# Patient Record
Sex: Female | Born: 1975 | Race: White | Hispanic: No | Smoking: Former smoker
Health system: Southern US, Community
[De-identification: ages and names within clinical notes are randomized; demographics above are authoritative.]

## PROBLEM LIST (undated history)

## (undated) DIAGNOSIS — F988 Other specified behavioral and emotional disorders with onset usually occurring in childhood and adolescence: Secondary | ICD-10-CM

## (undated) DIAGNOSIS — K635 Polyp of colon: Secondary | ICD-10-CM

## (undated) HISTORY — PX: FACIAL COSMETIC SURGERY: SHX629

## (undated) HISTORY — PX: ANTERIOR CERVICAL DECOMP/DISCECTOMY FUSION: SHX1161

## (undated) HISTORY — PX: OTHER SURGICAL HISTORY: SHX169

## (undated) HISTORY — DX: Other specified behavioral and emotional disorders with onset usually occurring in childhood and adolescence: F98.8

## (undated) HISTORY — DX: Polyp of colon: K63.5

---

## 1999-02-27 ENCOUNTER — Encounter: Payer: Self-pay | Admitting: Oral Surgery

## 1999-03-05 ENCOUNTER — Ambulatory Visit (HOSPITAL_COMMUNITY): Admission: RE | Admit: 1999-03-05 | Discharge: 1999-03-05 | Payer: Self-pay | Admitting: Oral Surgery

## 2000-08-18 ENCOUNTER — Other Ambulatory Visit: Admission: RE | Admit: 2000-08-18 | Discharge: 2000-08-18 | Payer: Self-pay | Admitting: *Deleted

## 2000-12-18 ENCOUNTER — Encounter: Admission: RE | Admit: 2000-12-18 | Discharge: 2001-03-18 | Payer: Self-pay | Admitting: *Deleted

## 2001-01-01 ENCOUNTER — Observation Stay (HOSPITAL_COMMUNITY): Admission: AD | Admit: 2001-01-01 | Discharge: 2001-01-02 | Payer: Self-pay | Admitting: *Deleted

## 2001-02-20 ENCOUNTER — Inpatient Hospital Stay (HOSPITAL_COMMUNITY): Admission: AD | Admit: 2001-02-20 | Discharge: 2001-02-20 | Payer: Self-pay | Admitting: Gynecology

## 2001-02-22 ENCOUNTER — Inpatient Hospital Stay (HOSPITAL_COMMUNITY): Admission: AD | Admit: 2001-02-22 | Discharge: 2001-02-22 | Payer: Self-pay | Admitting: Gynecology

## 2001-03-12 ENCOUNTER — Inpatient Hospital Stay (HOSPITAL_COMMUNITY): Admission: AD | Admit: 2001-03-12 | Discharge: 2001-03-14 | Payer: Self-pay | Admitting: Gynecology

## 2001-04-26 ENCOUNTER — Other Ambulatory Visit: Admission: RE | Admit: 2001-04-26 | Discharge: 2001-04-26 | Payer: Self-pay | Admitting: *Deleted

## 2003-07-01 ENCOUNTER — Inpatient Hospital Stay (HOSPITAL_COMMUNITY): Admission: AD | Admit: 2003-07-01 | Discharge: 2003-07-01 | Payer: Self-pay | Admitting: Obstetrics & Gynecology

## 2003-07-01 ENCOUNTER — Encounter: Payer: Self-pay | Admitting: Obstetrics & Gynecology

## 2003-10-30 ENCOUNTER — Inpatient Hospital Stay (HOSPITAL_COMMUNITY): Admission: AD | Admit: 2003-10-30 | Discharge: 2003-10-30 | Payer: Self-pay | Admitting: Obstetrics & Gynecology

## 2003-11-15 ENCOUNTER — Inpatient Hospital Stay (HOSPITAL_COMMUNITY): Admission: AD | Admit: 2003-11-15 | Discharge: 2003-11-16 | Payer: Self-pay | Admitting: *Deleted

## 2003-11-21 ENCOUNTER — Inpatient Hospital Stay (HOSPITAL_COMMUNITY): Admission: AD | Admit: 2003-11-21 | Discharge: 2003-11-23 | Payer: Self-pay | Admitting: Obstetrics and Gynecology

## 2004-01-11 ENCOUNTER — Other Ambulatory Visit: Admission: RE | Admit: 2004-01-11 | Discharge: 2004-01-11 | Payer: Self-pay | Admitting: Obstetrics and Gynecology

## 2010-01-19 ENCOUNTER — Inpatient Hospital Stay (HOSPITAL_COMMUNITY): Admission: AD | Admit: 2010-01-19 | Discharge: 2010-01-21 | Payer: Self-pay | Admitting: Obstetrics and Gynecology

## 2010-11-25 LAB — CBC
HCT: 34.2 % — ABNORMAL LOW (ref 36.0–46.0)
Hemoglobin: 11.9 g/dL — ABNORMAL LOW (ref 12.0–15.0)
MCHC: 34.7 g/dL (ref 30.0–36.0)
MCV: 91.7 fL (ref 78.0–100.0)
Platelets: 224 10*3/uL (ref 150–400)
RBC: 3.73 MIL/uL — ABNORMAL LOW (ref 3.87–5.11)
RDW: 13.9 % (ref 11.5–15.5)
WBC: 13.7 10*3/uL — ABNORMAL HIGH (ref 4.0–10.5)

## 2010-11-25 LAB — CCBB MATERNAL DONOR DRAW

## 2010-11-26 LAB — CBC
HCT: 37.7 % (ref 36.0–46.0)
Hemoglobin: 13.1 g/dL (ref 12.0–15.0)
MCHC: 34.6 g/dL (ref 30.0–36.0)
MCV: 91.2 fL (ref 78.0–100.0)
Platelets: 260 10*3/uL (ref 150–400)
RBC: 4.13 MIL/uL (ref 3.87–5.11)
RDW: 13.4 % (ref 11.5–15.5)
WBC: 11.6 10*3/uL — ABNORMAL HIGH (ref 4.0–10.5)

## 2010-11-26 LAB — GLUCOSE, CAPILLARY: Glucose-Capillary: 98 mg/dL (ref 70–99)

## 2010-11-26 LAB — RPR: RPR Ser Ql: NONREACTIVE

## 2011-01-24 NOTE — Consult Note (Signed)
[facility]  Patient:    Donna Bowen                  MRN: [id number] Attending:  Gaetano Hawthorne. Lily Peer, M.D.                          Consultation Report  HISTORY:                      The patient is a 35 year old gravida 2, para 1 at approximately 37-2/[redacted] weeks gestation who presented to [facility] for evaluation of an ongoing rash that she has experienced over the past few days. Approximately two days after having initiated Flagyl for bacterial vaginosis, she started noticing the rash.  The rash does not appear to be vesicular.  It is flat, noncrusted, slightly erythematous and raised, typical of PUPPS.  The patient denied any fever, nausea or vomiting.  The rash is noted mostly in the trunk and the lower extremities and not in the back, and some in the forearm area.  The patient was placed on the monitor.  She had a reactive nonstress test and frequent mild contractions noted.  CBC and comprehensive metabolic panel were otherwise unremarkable.  Her vital signs in the emergency room were as follows: temperature 97.4, pulse 83, respirations 20, blood pressure 128/74.  The patient will be released from the hospital on Deltasone tablets (pack), 5 mg to taper over one week.  She may continue applying the Benadryl or calamine lotion topically p.r.n. and, for an antihistamine agent, she was given a prescription for Claritin that she an take 10 mg p.o. q.d.  She is scheduled to follow up in the office next week for her routine office visit. Of note, there was no evidence of ______ noted.  All of the above was explained to the patient and her husband.  All questions were answered.  Will follow accordingly. DD:  02/20/01 TD:  02/20/01 Job: [id number]

## 2011-01-24 NOTE — Discharge Summary (Signed)
[facility]  Patient:    Donna Bowen, Donna Bowen                   MRN: [id number] Adm. Date:  [id number] Disc. Date: [id number] Attending:  Douglass Rivers Dictator:   Antony Contras, [facility]                           Discharge Summary  DISCHARGE DIAGNOSES:          1. Intrauterine pregnancy at term.                               2. History of marginal gestational diabetes.  PROCEDURES:                   1. Induction of labor.                               2. Normal spontaneous vaginal delivery of                                  viable infant over intact perineum with                                  repair of first-degree labial laceration.  HISTORY OF PRESENT ILLNESS:   The patient is a 35 year old, gravida 2, para 1-0-0-1 with LMP June 04, 2000 and [facility] March 11, 2001. Prenatal risk factors include a history of CIN-1 with cryosurgery, also marginal gestational diabetes.  PRENATAL LABORATORY DATA:     Blood type O positive, antibody screen negative. RPR, HBsAg, HIV nonreactive. MSAFP normal. GBS is negative.  HOSPITAL COURSE AND TREATMENT:                The patient was admitted on March 12, 2001 for induction of labor. The cervix was 2 to 3 cm. Artificial rupture of membranes revealed clear fluid. She progressed to complete dilatation, delivered an Apgar 8/9 female infant weighing 8 pounds 7 ounces over intact perineum with repair of a first-degree labial laceration.  POSTPARTUM COURSE:            She remained afebrile, had no difficulty voiding, and was able to be discharged in satisfactory condition on her second postpartum day.  CBC: Hematocrit 37.7, hemoglobin was 13.3, WBCs 10.8, platelets 214,000.  DISPOSITION:                  The patient is to follow up in six weeks.  DISCHARGE MEDICATIONS:        The patient is to continue with prenatal vitamins and iron, with Motrin and Tylox for pain. DD:  04/09/01 TD:  04/11/01 Job: [id number]

## 2011-01-24 NOTE — H&P (Signed)
NAME:  Donna Bowen, Donna Bowen                      ACCOUNT NO.:  [id number]   MEDICAL RECORD NO.:  [id number]                   PATIENT TYPE:   LOCATION:                                       FACILITY:  [facility]   PHYSICIAN:  Richardean Sale, M.D.                DATE OF BIRTH:  07-12-1976   DATE OF ADMISSION:  11/21/2003  DATE OF DISCHARGE:                                HISTORY & PHYSICAL   ADMISSION DIAGNOSES:  39-1/2 week pregnancy with history of macrosomic  infant, favorable cervix, group B beta strep positive.   HISTORY OF PRESENT ILLNESS:  This is a 35 year old, gravida 3, para 2-0-0-2,  white female who was at 39-1/[redacted] weeks gestation with estimated due date of  November 23, 2003, who presents for induction of labor secondary to history of  nine pound baby, group B beta strep positive, and favorable cervix. The  patient reports good fetal movement. Denies any vaginal bleeding or loss of  fluid. Reports irregular contractions. Pregnancy complicated by an elevated  one hour Glucola with a history of gestational diabetes, three-hour Glucola  was within normal limits.  The patient is formerly a smoker and quit in  2004.  She also had first trimester bleeding which has resolved.   PAST MEDICAL HISTORY:  Noncontributory.   PAST SURGICAL HISTORY:  None.   SOCIAL HISTORY:  Former smoker. First child given up for adoption.   OBSTETRIC HISTORY:  Vaginal delivery times two with history of gestational  diabetes and a 9 pound infant.   GYNECOLOGIC HISTORY:  Positive history of HPV. No history of gonorrhea,  Chlamydia, or herpes.   FAMILY HISTORY:  No known congenital anomalies, spina bifida, Down syndrome,  other genetic disorders, or inherited defects. No known coagulopathies or  blood dyscrasias.   PHYSICAL EXAMINATION:  VITAL SIGNS: She is afebrile. Her vital signs are  stable.  GENERAL: She is a well-developed, well-nourished white female in no apparent  distress.  HEART: Regular rate and  rhythm.  LUNGS: Clear to auscultation bilaterally.  ABDOMEN: Gravid, soft, and nontender. Appears AGA.  PELVIC EXAM: On cervical exam, 2 cm, 70%, -1, and vertex.  EXTREMITIES: No clubbing, cyanosis, or edema. Nontender.   LABORATORY DATA:  Group beta strep positive.  Blood type O positive.  Antibody screen negative. Antibody nonreactive. Rubella immune. Triple test  was within normal limits.  One-hour Glucola 144, three-hour Glucola within  normal limits. Hepatitis B negative. HIV negative. Gonorrhea and Chlamydia  negative. Pap smear within normal limits.   ASSESSMENT:  A 35 year old, gravida 2, para 2-0-0-2, white female at 39.5  weeks with favorable cervix, history of 9 pound baby at delivery, and group  B beta strep positive.   PLAN:  Will admit for Pitocin induction and amniotomy. Risks of induction  reviewed with the patient.  The patient is aware of her increased risks of  Cesarean section with induction. Will administer penicillin for group  B beta  strep prophylaxis, with continuous fetal monitoring while in labor.                                               Richardean Sale, M.D.    JW/MEDQ  D:  11/20/2003  T:  11/20/2003  Job:  [id number]

## 2011-04-24 ENCOUNTER — Other Ambulatory Visit: Payer: Self-pay | Admitting: Family Medicine

## 2011-04-24 ENCOUNTER — Ambulatory Visit (HOSPITAL_COMMUNITY)
Admission: RE | Admit: 2011-04-24 | Discharge: 2011-04-24 | Disposition: A | Discharge status: Home / Self Care | Payer: PRIVATE HEALTH INSURANCE | Source: Ambulatory Visit | Attending: Family Medicine | Admitting: Family Medicine

## 2011-04-24 DIAGNOSIS — M545 Low back pain, unspecified: Secondary | ICD-10-CM

## 2011-04-24 DIAGNOSIS — G8929 Other chronic pain: Secondary | ICD-10-CM

## 2012-12-28 ENCOUNTER — Telehealth: Payer: Self-pay | Admitting: Family Medicine

## 2012-12-28 NOTE — Telephone Encounter (Signed)
Rx upfront ready for pickup. Left message on voicemail notifying patient.

## 2012-12-28 NOTE — Telephone Encounter (Signed)
Needs a prescription for Adderall.  Please call patient when the prescription is ready to be picked up.

## 2012-12-28 NOTE — Telephone Encounter (Signed)
Ok. Must have a visit before any further written

## 2013-01-04 ENCOUNTER — Encounter: Payer: Self-pay | Admitting: *Deleted

## 2013-02-03 ENCOUNTER — Ambulatory Visit (INDEPENDENT_AMBULATORY_CARE_PROVIDER_SITE_OTHER): Payer: Commercial Managed Care - PPO | Admitting: Nurse Practitioner

## 2013-02-03 ENCOUNTER — Encounter: Payer: Self-pay | Admitting: Nurse Practitioner

## 2013-02-03 VITALS — BP 124/80 | HR 70 | Temp 97.9°F | Resp 14 | Ht 65.5 in | Wt 142.0 lb

## 2013-02-03 DIAGNOSIS — Z0000 Encounter for general adult medical examination without abnormal findings: Secondary | ICD-10-CM

## 2013-02-03 DIAGNOSIS — N925 Other specified irregular menstruation: Secondary | ICD-10-CM

## 2013-02-03 DIAGNOSIS — F988 Other specified behavioral and emotional disorders with onset usually occurring in childhood and adolescence: Secondary | ICD-10-CM

## 2013-02-03 DIAGNOSIS — N949 Unspecified condition associated with female genital organs and menstrual cycle: Secondary | ICD-10-CM

## 2013-02-03 DIAGNOSIS — N938 Other specified abnormal uterine and vaginal bleeding: Secondary | ICD-10-CM

## 2013-02-03 DIAGNOSIS — Z01419 Encounter for gynecological examination (general) (routine) without abnormal findings: Secondary | ICD-10-CM

## 2013-02-03 MED ORDER — AMPHETAMINE-DEXTROAMPHETAMINE 10 MG PO TABS: ORAL_TABLET | ORAL | Status: DC

## 2013-02-03 MED ORDER — AMPHETAMINE-DEXTROAMPHET ER 20 MG PO CP24: 20.0000 mg | ORAL_CAPSULE | ORAL | Status: DC

## 2013-02-03 MED ORDER — LEVONORGESTREL-ETHINYL ESTRAD 0.1-20 MG-MCG PO TABS: 1.0000 | ORAL_TABLET | Freq: Every day | ORAL | Status: DC

## 2013-02-04 ENCOUNTER — Encounter: Payer: Self-pay | Admitting: Nurse Practitioner

## 2013-02-04 DIAGNOSIS — F988 Other specified behavioral and emotional disorders with onset usually occurring in childhood and adolescence: Secondary | ICD-10-CM | POA: Insufficient documentation

## 2013-02-04 DIAGNOSIS — N938 Other specified abnormal uterine and vaginal bleeding: Secondary | ICD-10-CM | POA: Insufficient documentation

## 2013-02-04 NOTE — Assessment & Plan Note (Addendum)
Switch to Adderall XR 20 mg in the morning with his dose of Adderall 10 mg about 7-8 hours later. Recheck in 6 months.

## 2013-02-04 NOTE — Assessment & Plan Note (Signed)
Will do a trial of continuous low-dose birth control pills.

## 2013-02-04 NOTE — Progress Notes (Signed)
Subjective:    Patient ID: Donna Bowen, female    DOB: [birth year], 37 y.o.   MRN: [id number]  HPI presents for her wellness checkup. Married, same sexual partner. No vaginal discharge or pelvic pain or fever. Has permanent birth control. Having slightly irregular cycles which are now very heavy and painful for about 3 days. Also having spotting in between cycles lasting up to week. Nonsmoker. Regular eye exams. Regular dental exams. Is currently on Adderall twice a day. Would like to switch to ask Adderall XR in the morning with low dose Adderall for the afternoon. Currently in nursing school. Limited exercise due to her current schedule. Tries to eat a healthy diet but this is difficult.    Review of Systems  Constitutional: Negative for activity change, appetite change and fatigue.  HENT: Negative for dental problem.   Eyes: Negative for visual disturbance.  Respiratory: Negative for chest tightness, shortness of breath and wheezing.   Cardiovascular: Negative for chest pain and palpitations.  Gastrointestinal: Negative for vomiting, abdominal pain, diarrhea, constipation and abdominal distention.  Genitourinary: Positive for vaginal bleeding and menstrual problem. Negative for dysuria, vaginal discharge, difficulty urinating and pelvic pain.       Objective:   Physical Exam  Constitutional: She is oriented to person, place, and time. She appears well-developed. No distress.  HENT:  Right Ear: External ear normal.  Left Ear: External ear normal.  Mouth/Throat: Oropharynx is clear and moist.  Neck: Normal range of motion. Neck supple. No tracheal deviation present. No thyromegaly present.  Cardiovascular: Normal rate, regular rhythm and normal heart sounds.  Exam reveals no gallop.   No murmur heard. Pulmonary/Chest: Effort normal and breath sounds normal.  Abdominal: Soft. She exhibits no distension and no mass. There is no tenderness.  Genitourinary: Vagina normal and uterus  normal. No vaginal discharge found.  Musculoskeletal: She exhibits no edema.  Lymphadenopathy:    She has no cervical adenopathy.  Neurological: She is alert and oriented to person, place, and time.  Skin: Skin is warm and dry. No rash noted.  Psychiatric: She has a normal mood and affect. Her behavior is normal.          Assessment & Plan:  Well woman exam  ADD (attention deficit disorder)  DUB (dysfunctional uterine bleeding)  Meds ordered this encounter  Medications  . levonorgestrel-ethinyl estradiol (AVIANE,ALESSE,LESSINA) 0.1-20 MG-MCG tablet    Sig: Take 1 tablet by mouth daily.    Dispense:  3 Package    Refill:  3    Want to order continuous birth control pills; patient is a just received [employer] insurance; please hold Rx until she sets up account with you    Order Specific Question:  Supervising Provider    Answer:  Merlyn Albert [[id number]]  . amphetamine-dextroamphetamine (ADDERALL XR) 20 MG 24 hr capsule    Sig: Take 1 capsule (20 mg total) by mouth every morning.    Dispense:  30 capsule    Refill:  0    Order Specific Question:  Supervising Provider    Answer:  Merlyn Albert [[id number]]  . amphetamine-dextroamphetamine (ADDERALL) 10 MG tablet    Sig: Take on in the afternoon about 8 hours after morning dose    Dispense:  30 tablet    Refill:  0    Order Specific Question:  Supervising Provider    Answer:  Merlyn Albert [[id number]]   Discussed risks associated with use of hormones including blood clots and cancer risk.  Will try low-dose birth control pills on a continuous basis to stop her bleeding. Recheck in 6 months, call back sooner if any problems. Next physical in one year.

## 2013-03-14 ENCOUNTER — Telehealth: Payer: Self-pay | Admitting: Family Medicine

## 2013-03-14 NOTE — Telephone Encounter (Signed)
Patient needs Rx for both her Adderall ... Also, she would like 3 month prescriptions for these.

## 2013-03-14 NOTE — Telephone Encounter (Signed)
Chart in rack to be brought back

## 2013-03-14 NOTE — Telephone Encounter (Signed)
  i need chart

## 2013-03-15 MED ORDER — AMPHETAMINE-DEXTROAMPHETAMINE 10 MG PO TABS: ORAL_TABLET | ORAL | Status: DC

## 2013-03-15 MED ORDER — AMPHETAMINE-DEXTROAMPHET ER 20 MG PO CP24: 20.0000 mg | ORAL_CAPSULE | ORAL | Status: DC

## 2013-03-15 NOTE — Telephone Encounter (Signed)
Pt needs both adderall xr and plain adderall written. 90 d ok. Tell pt needs ov with me before these are finished

## 2013-03-15 NOTE — Telephone Encounter (Signed)
Left message on voicemail notifying patient RX upfront ready for pickup and schedule OV.

## 2013-03-19 ENCOUNTER — Encounter (HOSPITAL_COMMUNITY): Payer: Self-pay | Admitting: *Deleted

## 2013-03-19 ENCOUNTER — Emergency Department (HOSPITAL_COMMUNITY)
Admission: EM | Admit: 2013-03-19 | Discharge: 2013-03-19 | Disposition: A | Discharge status: Home / Self Care | Payer: 59 | Source: Home / Self Care | Attending: Family Medicine | Admitting: Family Medicine

## 2013-03-19 DIAGNOSIS — J329 Chronic sinusitis, unspecified: Secondary | ICD-10-CM

## 2013-03-19 MED ORDER — FLUCONAZOLE 150 MG PO TABS: 150.0000 mg | ORAL_TABLET | Freq: Once | ORAL | Status: DC

## 2013-03-19 MED ORDER — AZITHROMYCIN 250 MG PO TABS: 250.0000 mg | ORAL_TABLET | Freq: Every day | ORAL | Status: DC

## 2013-03-19 NOTE — ED Provider Notes (Signed)
   History    CSN: [id number] Arrival date & time 03/19/13  1111  None    Chief Complaint  Patient presents with  . URI   (Consider location/radiation/quality/duration/timing/severity/associated sxs/prior Treatment) Patient is a 37 y.o. female presenting with URI. The history is provided by the patient. No language interpreter was used.  URI Presenting symptoms: congestion, cough, facial pain and rhinorrhea   Severity:  Moderate Onset quality:  Gradual Duration:  4 days Timing:  Constant Progression:  Worsening Chronicity:  New Worsened by:  Nothing tried Associated symptoms: headaches    Past Medical History  Diagnosis Date  . ADD (attention deficit disorder)     Adult   History reviewed. No pertinent past surgical history. No family history on file. History  Substance Use Topics  . Smoking status: Former Games developer  . Smokeless tobacco: Not on file  . Alcohol Use: Yes   OB History   Grav Para Term Preterm Abortions TAB SAB Ect Mult Living                 Review of Systems  HENT: Positive for congestion and rhinorrhea.   Respiratory: Positive for cough.   Neurological: Positive for headaches.  All other systems reviewed and are negative.    Allergies  Review of patient's allergies indicates no known allergies.  Home Medications   Current Outpatient Rx  Name  Route  Sig  Dispense  Refill  . amphetamine-dextroamphetamine (ADDERALL XR) 20 MG 24 hr capsule   Oral   Take 1 capsule (20 mg total) by mouth every morning.   90 capsule   0   . amphetamine-dextroamphetamine (ADDERALL) 10 MG tablet      Take on in the afternoon about 8 hours after morning dose   90 tablet   0   . levonorgestrel-ethinyl estradiol (AVIANE,ALESSE,LESSINA) 0.1-20 MG-MCG tablet   Oral   Take 1 tablet by mouth daily.   3 Package   3     Want to order continuous birth control pills; pati ...    BP 128/85  Pulse 87  Temp(Src) 98.4 F (36.9 C) (Oral)  Resp 14  SpO2 100%  LMP  03/12/2013 Physical Exam  Nursing note and vitals reviewed. HENT:  Head: Normocephalic.  Right Ear: External ear normal.  Left Ear: External ear normal.  Nose: Nose normal.  Mouth/Throat: Oropharynx is clear and moist.  Eyes: Conjunctivae are normal. Pupils are equal, round, and reactive to light.  Tender maxillay sinuses  Neck: Normal range of motion. Neck supple.  Abdominal: Soft.  Musculoskeletal: Normal range of motion.  Neurological: She is alert.  Skin: Skin is warm.    ED Course  Procedures (including critical care time) Labs Reviewed - No data to display No results found. 1. Sinusitis     MDM  z pack,   diflucan  Elson Areas, PA-C 03/19/13 1244

## 2013-03-19 NOTE — ED Notes (Signed)
Pt  Reports  Symptoms  Of  Sinus  Congestion   /  Cough          Scratchy  Throat       And  A  Headache  Symptoms  X  4  Days        Pt  Setting  Upright on  Exam  Table  Speaking inn complete  sentances  And  Is  In no  Acute  Distress

## 2013-03-21 NOTE — ED Provider Notes (Signed)
Medical screening examination/treatment/procedure(s) were performed by non-physician practitioner and as supervising physician I was immediately available for consultation/collaboration.   [facility]; MD  Sharin Grave, MD 03/21/13 1744

## 2013-04-19 ENCOUNTER — Telehealth: Payer: Self-pay | Admitting: Family Medicine

## 2013-04-19 ENCOUNTER — Other Ambulatory Visit: Payer: Self-pay | Admitting: Family Medicine

## 2013-04-19 NOTE — Telephone Encounter (Signed)
Diflucan 150 numb two one po 3 d apart

## 2013-04-19 NOTE — Telephone Encounter (Signed)
Patient needs Rx for yeast infection to [facility] [phone]  Lorain Childes she is in [place] visiting parents

## 2013-04-19 NOTE — Telephone Encounter (Signed)
Called in Diflucan 150 numb two one po 3 d apart to number listed below. Left message on voicemail notifying patient.

## 2013-04-22 ENCOUNTER — Other Ambulatory Visit: Payer: Self-pay | Admitting: Family Medicine

## 2013-06-21 ENCOUNTER — Encounter: Payer: Self-pay | Admitting: Family Medicine

## 2013-06-21 ENCOUNTER — Ambulatory Visit (INDEPENDENT_AMBULATORY_CARE_PROVIDER_SITE_OTHER): Payer: Commercial Managed Care - PPO | Admitting: Family Medicine

## 2013-06-21 ENCOUNTER — Ambulatory Visit: Payer: Commercial Managed Care - PPO | Admitting: Family Medicine

## 2013-06-21 VITALS — BP 118/78 | Ht 64.0 in | Wt 145.4 lb

## 2013-06-21 DIAGNOSIS — M67919 Unspecified disorder of synovium and tendon, unspecified shoulder: Secondary | ICD-10-CM

## 2013-06-21 DIAGNOSIS — N925 Other specified irregular menstruation: Secondary | ICD-10-CM

## 2013-06-21 DIAGNOSIS — F988 Other specified behavioral and emotional disorders with onset usually occurring in childhood and adolescence: Secondary | ICD-10-CM

## 2013-06-21 DIAGNOSIS — N949 Unspecified condition associated with female genital organs and menstrual cycle: Secondary | ICD-10-CM

## 2013-06-21 DIAGNOSIS — N938 Other specified abnormal uterine and vaginal bleeding: Secondary | ICD-10-CM

## 2013-06-21 DIAGNOSIS — M7552 Bursitis of left shoulder: Secondary | ICD-10-CM

## 2013-06-21 MED ORDER — AMPHETAMINE-DEXTROAMPHET ER 20 MG PO CP24: 20.0000 mg | ORAL_CAPSULE | ORAL | Status: DC

## 2013-06-21 MED ORDER — AMPHETAMINE-DEXTROAMPHETAMINE 10 MG PO TABS: ORAL_TABLET | ORAL | Status: DC

## 2013-06-21 NOTE — Progress Notes (Signed)
  Subjective:    Patient ID: Donna Bowen, female    DOB: 1976-04-26, 37 y.o.   MRN: [id number]  HPI Comments: Patient also needs a refill on Adderall.   Arm Pain  The incident occurred more than 1 week ago. The incident occurred at home. There was no injury mechanism. The pain is present in the upper left arm. The quality of the pain is described as aching. The pain does not radiate. The pain has been constant (Constantly hurts every few days for a month now) since the incident. Nothing aggravates the symptoms. She has tried nothing for the symptoms.   adderall ext release helps--taking regularly. Adds short acting ten mg later in the day. Dry mouth at times with the med.  Dull ache, no sig radiation, around a month, recalls no injury, no major pain , Deep ache at times. ibuprofen   Review of Systems Chest pain decent appetite no abdominal pain ROS otherwise negative    Objective:   Physical Exam  Alert HEENT normal. Lungs clear. Heart regular rate and rhythm.  Left shoulder good range of motion. No impingement sign. Good strength. Patient points towards deltoid his primary source of pain. No point tenderness.      Assessment & Plan:   Impression 1 ADHD clinically stable. Good control on meds. #2 shoulder pain likely deltoid bursitis or similar discussed. No evidence of intra-articular difficulties. Plan Aleve 2 tablets twice per day. Adderall XR and Adderall refilled. Recheck in 6 months. WSL

## 2013-06-21 NOTE — Patient Instructions (Signed)
aleave two tabs with food for ten days

## 2013-07-21 ENCOUNTER — Ambulatory Visit (INDEPENDENT_AMBULATORY_CARE_PROVIDER_SITE_OTHER): Payer: Commercial Managed Care - PPO | Admitting: Family Medicine

## 2013-07-21 ENCOUNTER — Encounter: Payer: Self-pay | Admitting: Family Medicine

## 2013-07-21 VITALS — BP 112/74 | Temp 100.1°F | Ht 64.0 in | Wt 149.8 lb

## 2013-07-21 DIAGNOSIS — A088 Other specified intestinal infections: Secondary | ICD-10-CM

## 2013-07-21 DIAGNOSIS — A084 Viral intestinal infection, unspecified: Secondary | ICD-10-CM

## 2013-07-21 MED ORDER — ONDANSETRON 4 MG PO TBDP: 4.0000 mg | ORAL_TABLET | Freq: Three times a day (TID) | ORAL | Status: DC | PRN

## 2013-07-21 NOTE — Progress Notes (Signed)
  Subjective:    Patient ID: Donna Bowen, female    DOB: 1976-02-15, 37 y.o.   MRN: [id number]  Emesis  This is a new problem. The current episode started yesterday. Associated symptoms include diarrhea, a fever and myalgias. She has tried acetaminophen (ibuprofen) for the symptoms.   Back pain off and on for awhile. 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  Vomited cluster stimes three  vom started befire the duarrhea  Stomach discomfort is cramping and full and then nauseated  102 mx, 600 mg every six hrs  Took ibuprofe, achey appetite diminished, all started   Review of Systems  Constitutional: Positive for fever.  Gastrointestinal: Positive for vomiting and diarrhea.  Musculoskeletal: Positive for myalgias.   ROS otherwise negative     Objective:   Physical Exam  Alert HEENT normal. Lungs clear. Heart regular rate and rhythm. Abdomen hyperactive bowel sounds. No discrete tenderness. Diffuse mild discomfort. No rebound no guarding.      Assessment & Plan:  Impression viral gastroenteritis. Plan symptomatic care discussed. Zofran when necessary for nausea. Dietary interventions discussed. WSL

## 2013-08-15 ENCOUNTER — Encounter: Payer: Self-pay | Admitting: Family Medicine

## 2013-08-15 ENCOUNTER — Ambulatory Visit (INDEPENDENT_AMBULATORY_CARE_PROVIDER_SITE_OTHER): Payer: Commercial Managed Care - PPO | Admitting: Family Medicine

## 2013-08-15 VITALS — BP 110/62 | Temp 97.7°F | Ht 64.0 in | Wt 144.0 lb

## 2013-08-15 DIAGNOSIS — J310 Chronic rhinitis: Secondary | ICD-10-CM

## 2013-08-15 DIAGNOSIS — J329 Chronic sinusitis, unspecified: Secondary | ICD-10-CM

## 2013-08-15 MED ORDER — AZITHROMYCIN 250 MG PO TABS: ORAL_TABLET | ORAL | Status: DC

## 2013-08-15 NOTE — Progress Notes (Signed)
   Subjective:    Patient ID: Donna Bowen, female    DOB: April 17, 1976, 37 y.o.   MRN: [id number]  Sinus Problem This is a new problem. Associated symptoms include coughing and headaches.    cong started last thur eve.   Felt a cold and sinus headache.  And now headache, frontal, body aches with congestion  Sudafed helped   Mostly frontal, Diminished energy and achey all over  Neg smoke exposure,,     Review of Systems  Respiratory: Positive for cough.   Neurological: Positive for headaches.   no vomiting no diarrhea no rash ROS otherwise negative     Objective:   Physical Exam  Alert hydration good. HEENT moderate nasal congestion frontal tenderness pharynx slight erythema neck supple. Lungs bronchial cough during exam heart regular in rhythm.      Assessment & Plan:  in impression 1 rhinosinusitis #2 bronchitis plan antibiotics prescribed. Symptomatic care discussed. Warning signs discussed. WSL

## 2013-11-09 ENCOUNTER — Telehealth: Payer: Self-pay | Admitting: Family Medicine

## 2013-11-09 NOTE — Telephone Encounter (Signed)
See jacob's

## 2013-11-09 NOTE — Telephone Encounter (Signed)
Patient called stating took daughter to ER and was treated for pinworm. ER doctor suggested both parents get prescription to be treated all for pinworm from their [facility] doctor,

## 2013-11-09 NOTE — Telephone Encounter (Signed)
Notified patient via VM stating there is no longer a RX for pinworms. [facility] carries [facility] Pinworm Med that is OTC.

## 2014-02-17 ENCOUNTER — Telehealth: Payer: Self-pay | Admitting: Family Medicine

## 2014-02-17 MED ORDER — AMPHETAMINE-DEXTROAMPHETAMINE 10 MG PO TABS: ORAL_TABLET | ORAL | Status: DC

## 2014-02-17 MED ORDER — AMPHETAMINE-DEXTROAMPHET ER 20 MG PO CP24: 20.0000 mg | ORAL_CAPSULE | ORAL | Status: DC

## 2014-02-17 NOTE — Telephone Encounter (Signed)
Rx up front for patient pick up. Patient notified.

## 2014-02-17 NOTE — Telephone Encounter (Signed)
Pt already scheduled with Hoyle Sauer is that ok?

## 2014-02-17 NOTE — Telephone Encounter (Signed)
amphetamine-dextroamphetamine (ADDERALL XR) 20 MG 24 hr capsule  Pt is having testing late next week, was wanting an appt but there  Aren't any available due to our heavy patient load. She is scheduled for the  22nd of June.   Can we give her one refill or enough to this appt?   Last seen 10/14  Last filled 1/15?   Stated she only needed the 43m for sure right now but if you want to refill  Both that is fine, she is just about about of both meds

## 2014-02-17 NOTE — Telephone Encounter (Signed)
May do 2 weeks on her meds then see Dr Jerilynn Mages

## 2014-03-03 ENCOUNTER — Encounter: Payer: Commercial Managed Care - PPO | Admitting: Nurse Practitioner

## 2014-03-03 ENCOUNTER — Telehealth: Payer: Self-pay | Admitting: Family Medicine

## 2014-03-03 NOTE — Telephone Encounter (Signed)
Patient stated she has been really busy and it slipped her mind

## 2014-03-03 NOTE — Telephone Encounter (Signed)
Call pt and ask her why she missed today's appt. We still have many to see this eve and will not be able to fax ths letter til [facility]

## 2014-03-03 NOTE — Telephone Encounter (Signed)
Patient just no showed her appt for this afternoon for ADD check up- Last ADD check up 12/14

## 2014-03-03 NOTE — Telephone Encounter (Signed)
Starting a new job Monday.  Needs a note sent over to her work today saying she is taking Adderall 55m. Her blood work showed that it was in her system and they have to have a note before she can start her job. Fax to [phone] Dr. ADia Sitter  Call and her let her know that it was done please.

## 2014-03-05 NOTE — Telephone Encounter (Signed)
For Dr Shaune Spittle

## 2014-08-08 ENCOUNTER — Other Ambulatory Visit: Payer: Self-pay | Admitting: Family Medicine

## 2014-10-05 ENCOUNTER — Ambulatory Visit (INDEPENDENT_AMBULATORY_CARE_PROVIDER_SITE_OTHER): Payer: BLUE CROSS/BLUE SHIELD | Admitting: Nurse Practitioner

## 2014-10-05 ENCOUNTER — Encounter: Payer: Self-pay | Admitting: Nurse Practitioner

## 2014-10-05 VITALS — BP 118/72 | Ht 64.0 in | Wt 154.2 lb

## 2014-10-05 DIAGNOSIS — Z01419 Encounter for gynecological examination (general) (routine) without abnormal findings: Secondary | ICD-10-CM

## 2014-10-05 DIAGNOSIS — Z124 Encounter for screening for malignant neoplasm of cervix: Secondary | ICD-10-CM

## 2014-10-05 DIAGNOSIS — Z1231 Encounter for screening mammogram for malignant neoplasm of breast: Secondary | ICD-10-CM

## 2014-10-05 DIAGNOSIS — Z0000 Encounter for general adult medical examination without abnormal findings: Secondary | ICD-10-CM

## 2014-10-05 DIAGNOSIS — R5383 Other fatigue: Secondary | ICD-10-CM

## 2014-10-05 DIAGNOSIS — J0100 Acute maxillary sinusitis, unspecified: Secondary | ICD-10-CM

## 2014-10-05 MED ORDER — AZITHROMYCIN 250 MG PO TABS: ORAL_TABLET | ORAL | Status: DC

## 2014-10-05 MED ORDER — FLUCONAZOLE 150 MG PO TABS: ORAL_TABLET | ORAL | Status: DC

## 2014-10-05 MED ORDER — METHYLPREDNISOLONE ACETATE 40 MG/ML IJ SUSP
40.0000 mg | Freq: Once | INTRAMUSCULAR | Status: AC
  Administered 2014-10-05: 40 mg via INTRAMUSCULAR

## 2014-10-05 NOTE — Patient Instructions (Signed)
Nasacort AQ as directed

## 2014-10-06 LAB — PAP IG W/ RFLX HPV ASCU

## 2014-10-08 ENCOUNTER — Encounter: Payer: Self-pay | Admitting: Nurse Practitioner

## 2014-10-08 NOTE — Progress Notes (Signed)
   Subjective:    Patient ID: Donna Bowen, female    DOB: 04-14-1976, 39 y.o.   MRN: [id number]  HPI presents for her wellness exam. Had ablation. Still having regular cycles, heavy first few days, lasting 7 days. Same sexual partner. Regular vision and dental exams. Joined [employer]. Overall healthy diet. Sinus symptoms x 2 d. Maxillary sinus pressure. No cough. Ear pressure. No sore throat or fever.     Review of Systems  Constitutional: Positive for fatigue. Negative for fever, activity change and appetite change.  HENT: Positive for congestion, postnasal drip, rhinorrhea and sinus pressure. Negative for dental problem, ear pain and sore throat.   Respiratory: Negative for cough, chest tightness, shortness of breath and wheezing.   Cardiovascular: Negative for chest pain.  Gastrointestinal: Negative for nausea, vomiting, abdominal pain, diarrhea, constipation and abdominal distention.  Genitourinary: Negative for dysuria, urgency, frequency, vaginal discharge, enuresis, difficulty urinating, genital sores, menstrual problem and pelvic pain.  Neurological: Positive for headaches.       Objective:   Physical Exam  Constitutional: She is oriented to person, place, and time. She appears well-developed. No distress.  HENT:  Right Ear: External ear normal.  Left Ear: External ear normal.  Clear effusion left ear; pharynx nonerythematous with green PND noted.   Neck: Normal range of motion. Neck supple. No tracheal deviation present. No thyromegaly present.  Mild anterior cervical adenopathy  Cardiovascular: Normal rate, regular rhythm and normal heart sounds.  Exam reveals no gallop.   No murmur heard. Pulmonary/Chest: Effort normal and breath sounds normal.  Abdominal: Soft. She exhibits no distension. There is no tenderness.  Genitourinary: Vagina normal and uterus normal. No vaginal discharge found.  External GU: no rashes or lesions. Vagina: no discharge. Cervix normal in  appearance. No CMT. Bimanual exam: no tenderness or masses.  Musculoskeletal: She exhibits no edema.  Neurological: She is alert and oriented to person, place, and time.  Skin: Skin is warm and dry. No rash noted.  Psychiatric: She has a normal mood and affect. Her behavior is normal.  Vitals reviewed. Breast exam: dense tissue; no masses; axillae no adenopathy.         Assessment & Plan:  Well woman exam - Plan: Pap IG w/ reflex to HPV when ASC-U, Lipid panel, Hepatic function panel, Basic metabolic panel  Acute maxillary sinusitis, recurrence not specified - Plan: methylPREDNISolone acetate (DEPO-MEDROL) injection 40 mg  Screening for cervical cancer - Plan: Pap IG w/ reflex to HPV when ASC-U  Other fatigue - Plan: TSH, Vit D  25 hydroxy (rtn osteoporosis monitoring), CBC with Differential/Platelet  Encounter for screening mammogram for breast cancer - Plan: MM DIGITAL SCREENING BILATERAL Nasacort AQ and antihistamine as directed. Call back if sinus symptoms persist.  Encouraged healthy diet, regular activity and daily vitamin D/calcium supplement.  Return in about 1 year (around 10/06/2015).

## 2014-11-14 ENCOUNTER — Ambulatory Visit (INDEPENDENT_AMBULATORY_CARE_PROVIDER_SITE_OTHER): Payer: BLUE CROSS/BLUE SHIELD | Admitting: Family Medicine

## 2014-11-14 ENCOUNTER — Encounter: Payer: Self-pay | Admitting: Family Medicine

## 2014-11-14 VITALS — BP 118/74 | Temp 98.3°F | Ht 64.0 in | Wt 146.0 lb

## 2014-11-14 DIAGNOSIS — J029 Acute pharyngitis, unspecified: Secondary | ICD-10-CM

## 2014-11-14 DIAGNOSIS — I889 Nonspecific lymphadenitis, unspecified: Secondary | ICD-10-CM

## 2014-11-14 LAB — POCT RAPID STREP A (OFFICE): Rapid Strep A Screen: NEGATIVE

## 2014-11-14 MED ORDER — AZITHROMYCIN 250 MG PO TABS: ORAL_TABLET | ORAL | Status: DC

## 2014-11-14 MED ORDER — FLUCONAZOLE 150 MG PO TABS: 150.0000 mg | ORAL_TABLET | Freq: Once | ORAL | Status: DC

## 2014-11-14 NOTE — Progress Notes (Signed)
   Subjective:    Patient ID: Donna Bowen, female    DOB: 03-Nov-1975, 39 y.o.   MRN: [id number]  Sore Throat  This is a new problem. Episode onset: 2 days ago. She has had exposure to strep. Exposure to: 2 sons have strep.   Pt leaving to go out of town tomorrow.   39 yr old and [age] yr odl was dx'ed with strep  Had to work on Sunday  Strep test was positive  Sore throat  Drainage and cong estion  yest morn stated   No drainage and or cough   Confirmed strep in family  Review of Systems  Results for orders placed or performed in visit on 11/14/14  POCT rapid strep A  Result Value Ref Range   Rapid Strep A Screen Negative Negative        Objective:   Physical Exam Alert moderate malaise HET moderate his congestion pharynx erythematous neck supple tender anterior nodes. Lungs clear. Heart regular in rhythm.       Assessment & Plan:  Impression pharyngitis lymphadenitis plan antibiotics prescribed. Symptom care discussed WSL

## 2014-12-11 ENCOUNTER — Other Ambulatory Visit (HOSPITAL_COMMUNITY): Payer: Self-pay | Admitting: Obstetrics and Gynecology

## 2014-12-11 DIAGNOSIS — N971 Female infertility of tubal origin: Secondary | ICD-10-CM

## 2014-12-15 ENCOUNTER — Ambulatory Visit (HOSPITAL_COMMUNITY)
Admission: RE | Admit: 2014-12-15 | Discharge: 2014-12-15 | Disposition: A | Discharge status: Home / Self Care | Payer: BLUE CROSS/BLUE SHIELD | Source: Ambulatory Visit | Attending: Obstetrics and Gynecology | Admitting: Obstetrics and Gynecology

## 2014-12-15 DIAGNOSIS — Z3049 Encounter for surveillance of other contraceptives: Secondary | ICD-10-CM | POA: Insufficient documentation

## 2014-12-15 DIAGNOSIS — D250 Submucous leiomyoma of uterus: Secondary | ICD-10-CM | POA: Insufficient documentation

## 2014-12-15 DIAGNOSIS — N840 Polyp of corpus uteri: Secondary | ICD-10-CM | POA: Insufficient documentation

## 2014-12-15 DIAGNOSIS — N971 Female infertility of tubal origin: Secondary | ICD-10-CM

## 2014-12-15 MED ORDER — IOHEXOL 300 MG/ML  SOLN
20.0000 mL | Freq: Once | INTRAMUSCULAR | Status: AC | PRN
  Administered 2014-12-15: 20 mL

## 2015-04-24 ENCOUNTER — Telehealth: Payer: Self-pay | Admitting: Family Medicine

## 2015-04-24 NOTE — Telephone Encounter (Signed)
Patient called having trouble sleeping in the daytime,she is working nights. She is wanting appointment on the 18th or 23rd the days she is off.She works 12 hr shift.

## 2015-04-24 NOTE — Telephone Encounter (Signed)
Give her 23rd

## 2015-04-24 NOTE — Telephone Encounter (Signed)
[facility]

## 2015-04-26 NOTE — Telephone Encounter (Signed)
[facility] 2nd time

## 2015-04-27 NOTE — Telephone Encounter (Signed)
Gave appt for 8/23 @ 1

## 2015-05-01 ENCOUNTER — Encounter: Payer: Self-pay | Admitting: Family Medicine

## 2015-05-01 ENCOUNTER — Ambulatory Visit: Payer: BLUE CROSS/BLUE SHIELD | Admitting: Family Medicine

## 2015-05-01 ENCOUNTER — Ambulatory Visit (INDEPENDENT_AMBULATORY_CARE_PROVIDER_SITE_OTHER): Payer: 59 | Admitting: Family Medicine

## 2015-05-01 VITALS — BP 130/82 | Ht 64.0 in | Wt 150.0 lb

## 2015-05-01 DIAGNOSIS — G4700 Insomnia, unspecified: Secondary | ICD-10-CM

## 2015-05-01 MED ORDER — TRAZODONE HCL 50 MG PO TABS: 25.0000 mg | ORAL_TABLET | Freq: Every evening | ORAL | Status: DC | PRN

## 2015-05-01 NOTE — Progress Notes (Signed)
   Subjective:    Patient ID: NOVAH NESSEL, female    DOB: 1976-03-17, 39 y.o.   MRN: [id number]  HPI Patient arrives with c/o insomnia. Patient states she is currently working nights and having a hard time sleeping  Melatonin takes three to five mg qhs  Overall sleeps well  Groggy if higher dose    during the day. Patient can go to sleep but cant stay asleep. Patient has tried benadryl but it makes her feel drowsy when she gets up. Patient will go back on days in October.  Review of Systems No headache no chest pain and back pain no abdominal pain    Objective:   Physical Exam Alert vitals stable. H&T normal. Lungs clear. Heart regular in rhythm. Ankles without edema.       Assessment & Plan:  Impression #1 insomnia discussed at length plan trial of trazodone 50 mg one half to one daily at bedtime when necessary. WSL

## 2015-05-03 ENCOUNTER — Encounter: Payer: Self-pay | Admitting: Nurse Practitioner

## 2015-05-04 ENCOUNTER — Other Ambulatory Visit: Payer: Self-pay | Admitting: Nurse Practitioner

## 2015-05-04 MED ORDER — FLUCONAZOLE 150 MG PO TABS: ORAL_TABLET | ORAL | Status: DC

## 2015-05-09 ENCOUNTER — Other Ambulatory Visit: Payer: Self-pay | Admitting: Nurse Practitioner

## 2015-05-24 ENCOUNTER — Telehealth: Payer: Self-pay | Admitting: Family Medicine

## 2015-05-24 ENCOUNTER — Encounter: Payer: Self-pay | Admitting: Nurse Practitioner

## 2015-05-24 MED ORDER — AZITHROMYCIN 250 MG PO TABS: ORAL_TABLET | ORAL | Status: DC

## 2015-05-24 NOTE — Telephone Encounter (Signed)
Med sent to pharm. Pt notified.  

## 2015-05-24 NOTE — Telephone Encounter (Signed)
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 Pt has a sore throat as well and wants to know if something can be called in or does she need to be seen. Please advise.   [facility] [place]

## 2015-05-24 NOTE — Telephone Encounter (Signed)
zpk

## 2015-06-24 ENCOUNTER — Other Ambulatory Visit: Payer: Self-pay | Admitting: Nurse Practitioner

## 2015-09-09 ENCOUNTER — Encounter (HOSPITAL_COMMUNITY): Payer: Self-pay | Admitting: *Deleted

## 2015-09-09 ENCOUNTER — Emergency Department (HOSPITAL_COMMUNITY)
Admission: EM | Admit: 2015-09-09 | Discharge: 2015-09-09 | Disposition: A | Discharge status: Home / Self Care | Payer: 59 | Source: Home / Self Care

## 2015-09-09 DIAGNOSIS — J069 Acute upper respiratory infection, unspecified: Secondary | ICD-10-CM | POA: Diagnosis not present

## 2015-09-09 MED ORDER — AZITHROMYCIN 250 MG PO TABS: ORAL_TABLET | ORAL | Status: DC

## 2015-09-09 MED ORDER — FLUCONAZOLE 200 MG PO TABS: 200.0000 mg | ORAL_TABLET | Freq: Every day | ORAL | Status: AC

## 2015-09-09 NOTE — ED Notes (Signed)
C/O head congestion with facial pain, nasal congestion, cough (slightly productive) x 3 days without fever.  Has been taking Sudafed.

## 2015-09-09 NOTE — Discharge Instructions (Signed)
Upper Respiratory Infection, Adult Most upper respiratory infections (URIs) are a viral infection of the air passages leading to the lungs. A URI affects the nose, throat, and upper air passages. The most common type of URI is nasopharyngitis and is typically referred to as "the common cold." URIs run their course and usually go away on their own. Most of the time, a URI does not require medical attention, but sometimes a bacterial infection in the upper airways can follow a viral infection. This is called a secondary infection. Sinus and middle ear infections are common types of secondary upper respiratory infections. Bacterial pneumonia can also complicate a URI. A URI can worsen asthma and chronic obstructive pulmonary disease (COPD). Sometimes, these complications can require emergency medical care and may be life threatening.  CAUSES Almost all URIs are caused by viruses. A virus is a type of germ and can spread from one person to another.  RISKS FACTORS You may be at risk for a URI if:   You smoke.   You have chronic heart or lung disease.  You have a weakened defense (immune) system.   You are very young or very old.   You have nasal allergies or asthma.  You work in crowded or poorly ventilated areas.  You work in health care facilities or schools. SIGNS AND SYMPTOMS  Symptoms typically develop 2-3 days after you come in contact with a cold virus. Most viral URIs last 7-10 days. However, viral URIs from the influenza virus (flu virus) can last 14-18 days and are typically more severe. Symptoms may include:   Runny or stuffy (congested) nose.   Sneezing.   Cough.   Sore throat.   Headache.   Fatigue.   Fever.   Loss of appetite.   Pain in your forehead, behind your eyes, and over your cheekbones (sinus pain).  Muscle aches.  DIAGNOSIS  Your health care provider may diagnose a URI by:  Physical exam.  Tests to check that your symptoms are not due to  another condition such as:  Strep throat.  Sinusitis.  Pneumonia.  Asthma. TREATMENT  A URI goes away on its own with time. It cannot be cured with medicines, but medicines may be prescribed or recommended to relieve symptoms. Medicines may help:  Reduce your fever.  Reduce your cough.  Relieve nasal congestion. HOME CARE INSTRUCTIONS   Take medicines only as directed by your health care provider.   Gargle warm saltwater or take cough drops to comfort your throat as directed by your health care provider.  Use a warm mist humidifier or inhale steam from a shower to increase air moisture. This may make it easier to breathe.  Drink enough fluid to keep your urine clear or pale yellow.   Eat soups and other clear broths and maintain good nutrition.   Rest as needed.   Return to work when your temperature has returned to normal or as your health care provider advises. You may need to stay home longer to avoid infecting others. You can also use a face mask and careful hand washing to prevent spread of the virus.  Increase the usage of your inhaler if you have asthma.   Do not use any tobacco products, including cigarettes, chewing tobacco, or electronic cigarettes. If you need help quitting, ask your health care provider. PREVENTION  The best way to protect yourself from getting a cold is to practice good hygiene.   Avoid oral or hand contact with people with cold   symptoms.   Wash your hands often if contact occurs.  There is no clear evidence that vitamin C, vitamin E, echinacea, or exercise reduces the chance of developing a cold. However, it is always recommended to get plenty of rest, exercise, and practice good nutrition.  SEEK MEDICAL CARE IF:   You are getting worse rather than better.   Your symptoms are not controlled by medicine.   You have chills.  You have worsening shortness of breath.  You have brown or red mucus.  You have yellow or brown nasal  discharge.  You have pain in your face, especially when you bend forward.  You have a fever.  You have swollen neck glands.  You have pain while swallowing.  You have white areas in the back of your throat. SEEK IMMEDIATE MEDICAL CARE IF:   You have severe or persistent:  Headache.  Ear pain.  Sinus pain.  Chest pain.  You have chronic lung disease and any of the following:  Wheezing.  Prolonged cough.  Coughing up blood.  A change in your usual mucus.  You have a stiff neck.  You have changes in your:  Vision.  Hearing.  Thinking.  Mood. MAKE SURE YOU:   Understand these instructions.  Will watch your condition.  Will get help right away if you are not doing well or get worse.   This information is not intended to replace advice given to you by your health care provider. Make sure you discuss any questions you have with your health care provider.   Document Released: 02/18/2001 Document Revised: 01/09/2015 Document Reviewed: 11/30/2013 Elsevier Interactive Patient Education 2016 Elsevier Inc.  

## 2015-09-09 NOTE — ED Provider Notes (Signed)
CSN: [id number]     Arrival date & time 09/09/15  1337 History   None    Chief Complaint  Patient presents with  . Nasal Congestion  . 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 Not getting any better with OTC meds Past Medical History  Diagnosis Date  . ADD (attention deficit disorder)     Adult   History reviewed. No pertinent past surgical history. Family History  Problem Relation Age of Onset  . Adopted: Yes  . Diabetes Maternal Grandmother   . Dementia Maternal Grandfather   . Heart disease Maternal Grandfather   . Osteoporosis Mother    Social History  Substance Use Topics  . Smoking status: Former Research scientist (life sciences)  . Smokeless tobacco: None  . Alcohol Use: None   OB History    No data available     Review of Systems ROS +'ve URI symptoms.   Denies: HEADACHE, NAUSEA, ABDOMINAL PAIN, CHEST PAIN, CONGESTION, DYSURIA, SHORTNESS OF BREATH  Allergies  Review of patient's allergies indicates no known allergies.  Home Medications   Prior to Admission medications   Medication Sig Start Date End Date Taking? Authorizing Provider  azithromycin (ZITHROMAX Z-PAK) 250 MG tablet Take 2 tablets (500 mg) on  Day 1,  followed by 1 tablet (250 mg) once daily on Days 2 through 5. 05/24/15   Mikey Kirschner, MD  fluconazole (DIFLUCAN) 150 MG tablet TAKE 1 TABLET BY MOUTH EVERY DAY AS NEEDED FOR YEAST INFECTION. MAY REPEAT IN 3-4 DAYS AS NEEDED 06/25/15   Mikey Kirschner, MD  traZODone (DESYREL) 50 MG tablet Take 0.5-1 tablets (25-50 mg total) by mouth at bedtime as needed for sleep. 05/01/15   Mikey Kirschner, MD   Meds Ordered and Administered this Visit  Medications - No data to display  BP 133/88 mmHg  Pulse 88  Temp(Src) 98.9 F (37.2 C) (Oral)  Resp 18  SpO2 100%  LMP 09/04/2015 (Approximate) No data found.   Physical Exam   Constitutional: She is oriented to person, place, and time. She appears well-developed and well-nourished. No distress.  HENT:  Head: Normocephalic and atraumatic.  Right Ear: External ear normal.  Left Ear: External ear normal.  Nose: Nose normal.  Mouth/Throat: Oropharynx is clear and moist. No oropharyngeal exudate.  Eyes: Conjunctivae are normal.  Neck: Normal range of motion. Neck supple.  Cardiovascular: Normal rate.   Pulmonary/Chest: Effort normal and breath sounds normal.  Abdominal: Soft.  Musculoskeletal: Normal range of motion.  Neurological: She is alert and oriented to person, place, and time.  Skin: Skin is warm and dry.  Psychiatric: She has a normal mood and affect. Her behavior is normal. Judgment and thought content normal.  Nursing note and vitals reviewed.   ED Course  Procedures (including critical care time)  Labs Review Labs Reviewed - No data to display  Imaging Review No results found.   Visual Acuity Review  Right Eye Distance:   Left Eye Distance:   Bilateral Distance:    Right Eye Near:   Left Eye Near:    Bilateral Near:         MDM   1. Acute URI     Patient is advised to continue home symptomatic treatment. Prescription for z  sent pharmacy patient has indicated. Patient is advised that if there are new or worsening symptoms or attend the emergency department, or contact primary care provider. Instructions  of care provided discharged home in stable condition.  THIS NOTE WAS GENERATED USING A VOICE RECOGNITION SOFTWARE PROGRAM. ALL REASONABLE EFFORTS  WERE MADE TO PROOFREAD THIS DOCUMENT FOR ACCURACY.     Konrad Felix, [place] 09/09/15 [phone]

## 2015-12-30 ENCOUNTER — Other Ambulatory Visit: Payer: Self-pay | Admitting: Family Medicine

## 2016-01-28 DIAGNOSIS — Z114 Encounter for screening for human immunodeficiency virus [HIV]: Secondary | ICD-10-CM | POA: Diagnosis not present

## 2016-01-28 DIAGNOSIS — Z113 Encounter for screening for infections with a predominantly sexual mode of transmission: Secondary | ICD-10-CM | POA: Diagnosis not present

## 2016-01-29 ENCOUNTER — Other Ambulatory Visit: Payer: Self-pay | Admitting: Nurse Practitioner

## 2016-04-19 IMAGING — RF DG HYSTEROGRAM
7 series · 7 of 7 positions shown · IV contrast (omnipaque)
Comparison: None.

FLUOROSCOPY TIME:  0 minutes 54 seconds

Images acquired: 7

CLINICAL DATA: Status post Essure microinsert placement for
contraception. Evaluate microinsert location and tubal patency.

EXAM:
HYSTEROSALPINGOGRAM
TECHNIQUE: Following cleansing of the cervix and vagina with Betadine solution,
a hysterosalpingogram was performed using a 5-French
hysterosalpingogram catheter and Omnipaque 300 contrast. The patient
tolerated the examination without difficulty.

[Series 1: run · 1 of 1 slices shown (1 of 7)]
[im 1/1]
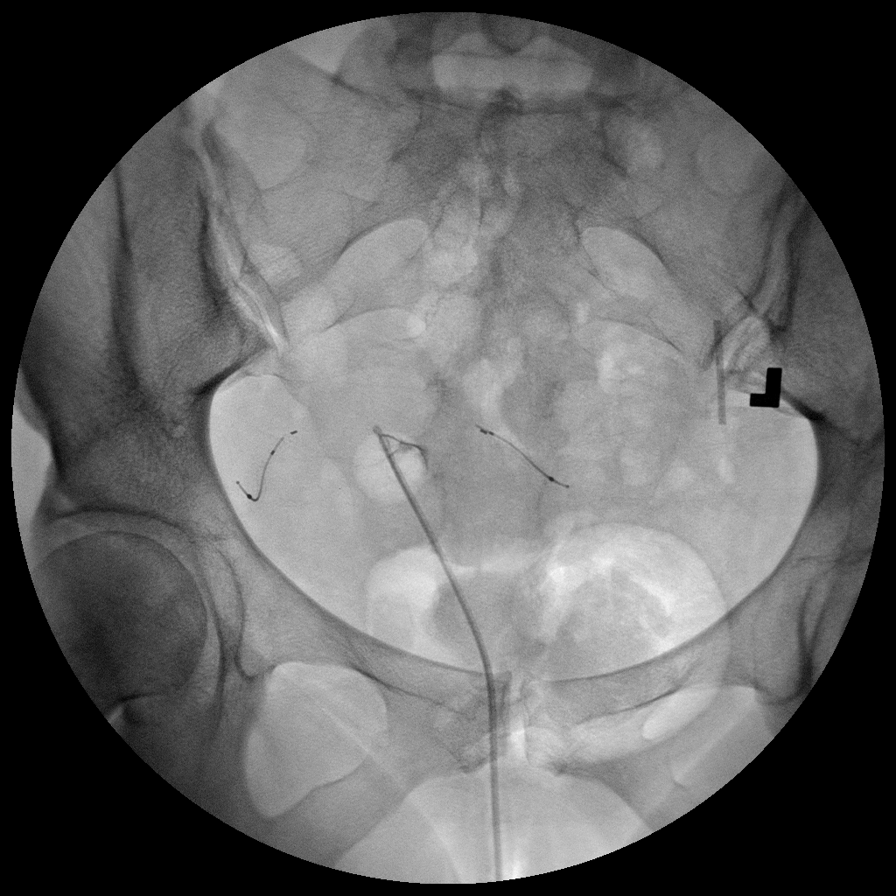

[Series 2: run · 1 of 1 slices shown (2 of 7)]
[im 1/1]
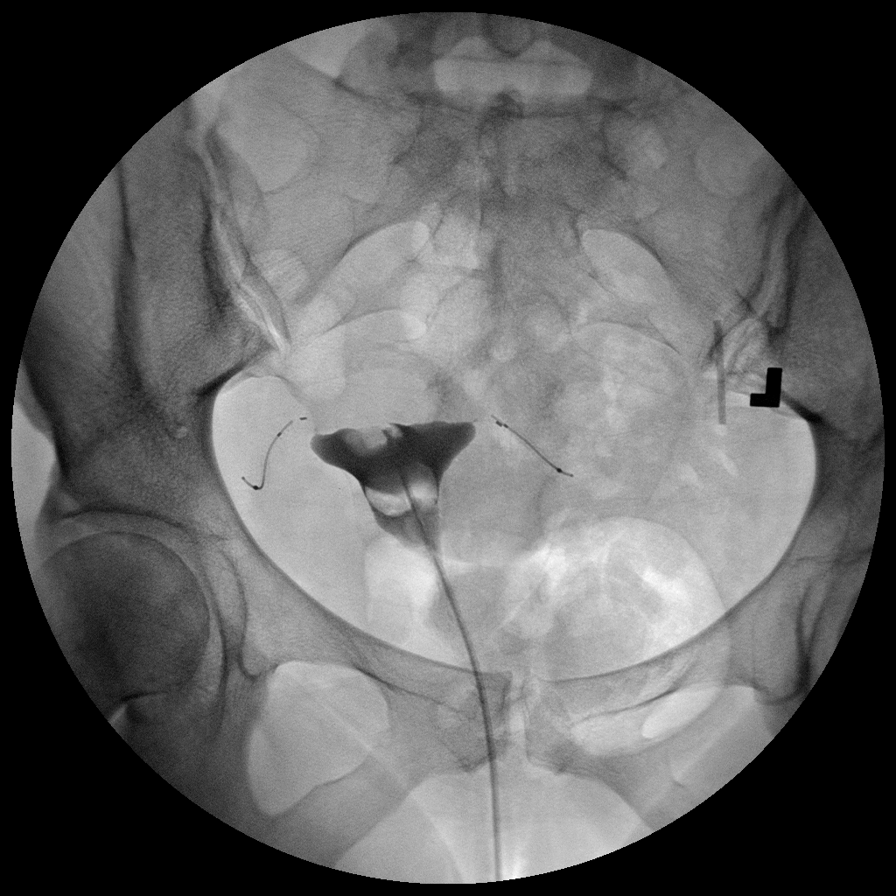

[Series 3: run · 1 of 1 slices shown (3 of 7)]
[im 1/1]
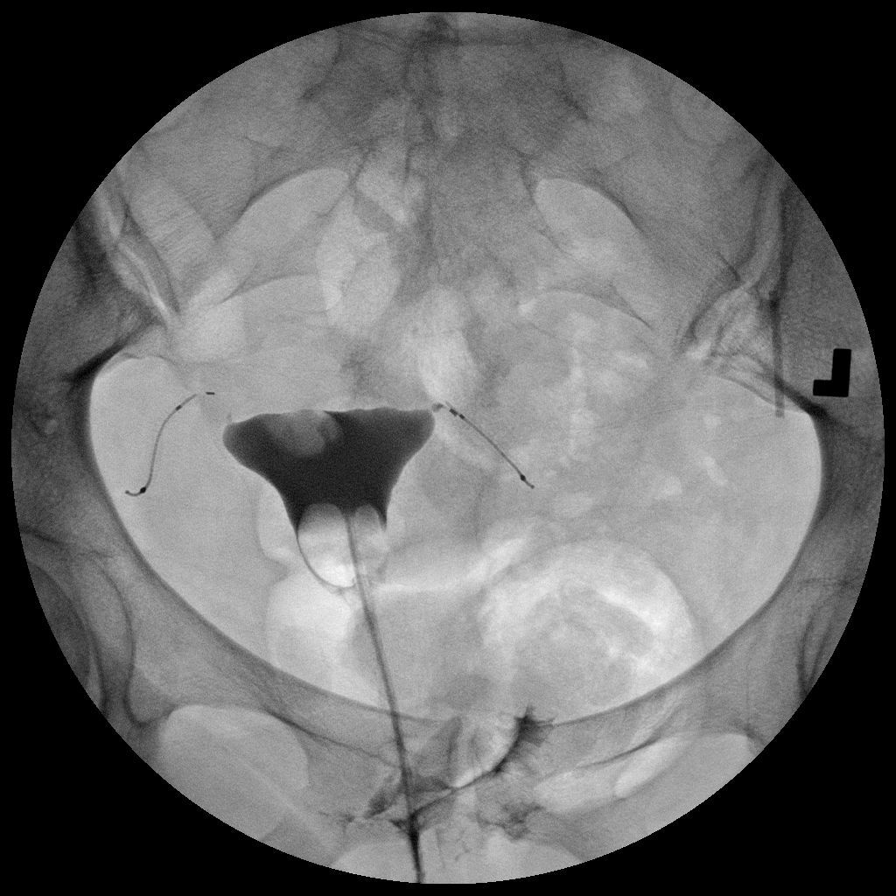

[Series 4: run · 1 of 1 slices shown (4 of 7)]
[im 1/1]
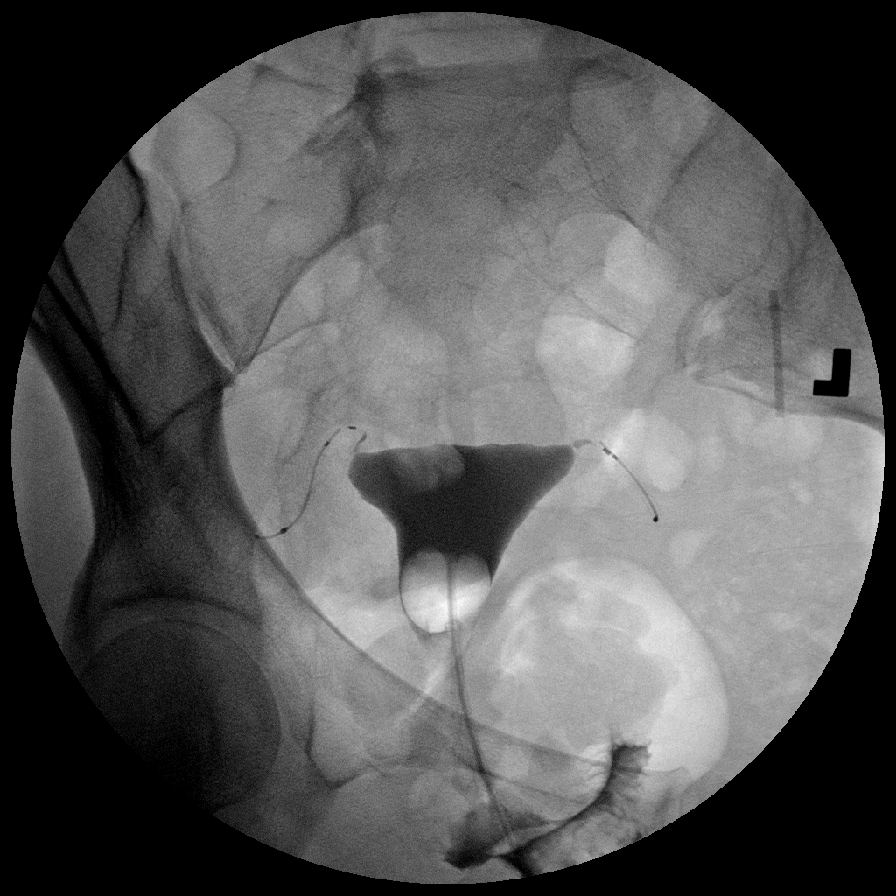

[Series 5: run · 1 of 1 slices shown (5 of 7)]
[im 1/1]
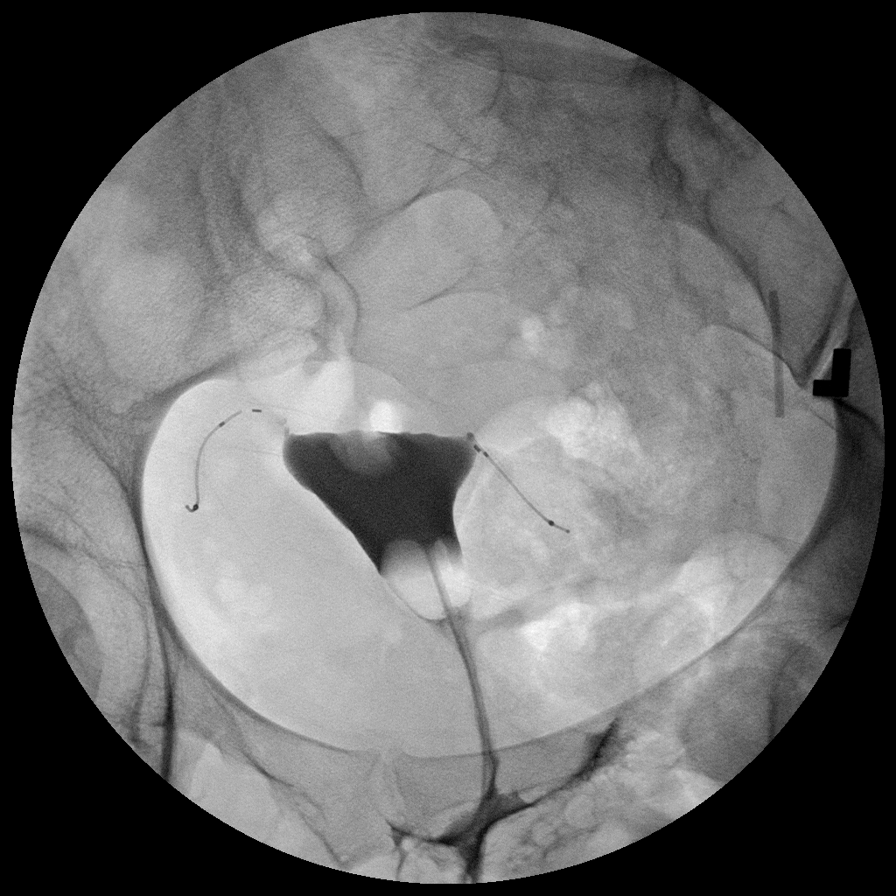

[Series 6: run · 1 of 1 slices shown (6 of 7)]
[im 1/1]
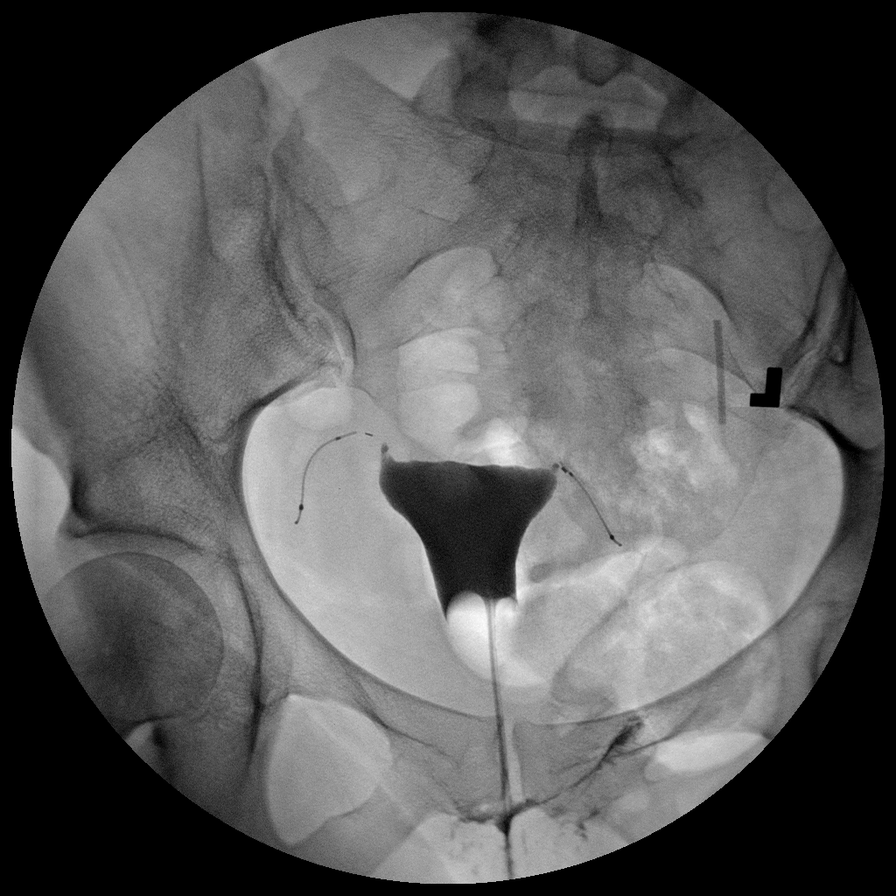

[Series 7: run · 1 of 1 slices shown (7 of 7)]
[im 1/1]
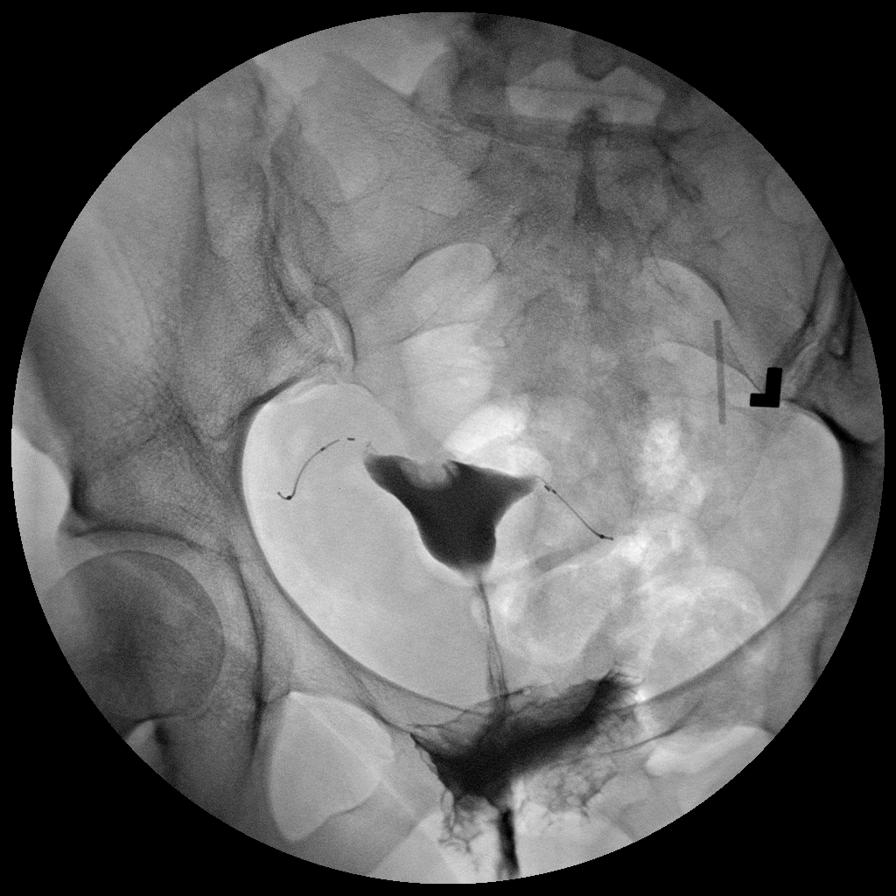

[7 of 7 positions shown; findings below may reference images not displayed]

FINDINGS: Endometrial Cavity: No signs of Mullerian duct anomaly. There is a
rounded filling defect in the endometrial cavity, arising from the
fundal wall just to the right of midline. This could represent a
submucosal fibroid or endometrial polyp.

Right Fallopian Tube: The Essure microinsert is in satisfactory
location. No contrast opacification of the right fallopian tube,
consistent with occlusion at the cornua (category 1).

Left Fallopian Tube: The Essure microinsert is in satisfactory
location. No contrast opacification of the right fallopian tube
seen, consistent with occlusion at the cornua (category 1).

Other:  None.
IMPRESSION: Both Essure microinserts in satisfactory location, with occlusion of
both fallopian tubes at the cornua (category 1).

Submucosal fibroid versus endometrial polyp in the fundal portion of
the endometrial cavity. Consider pelvic ultrasound for further
evaluation.

## 2016-05-01 ENCOUNTER — Other Ambulatory Visit: Payer: Self-pay | Admitting: Family Medicine

## 2016-05-01 NOTE — Telephone Encounter (Signed)
30 days needs visit with me or carolyn  Before further

## 2016-06-16 ENCOUNTER — Other Ambulatory Visit: Payer: Self-pay | Admitting: Family Medicine

## 2016-06-16 MED ORDER — TRAZODONE HCL 50 MG PO TABS: ORAL_TABLET | ORAL | 0 refills | Status: DC

## 2016-06-16 NOTE — Telephone Encounter (Signed)
Ok times 30 d only not seen greater than one yr, needs o v

## 2016-06-16 NOTE — Addendum Note (Signed)
Addended by: Ofilia Neas R on: 06/16/2016 01:32 PM   Modules accepted: Orders

## 2016-06-26 ENCOUNTER — Other Ambulatory Visit: Payer: Self-pay | Admitting: Nurse Practitioner

## 2016-06-27 ENCOUNTER — Telehealth: Payer: 59 | Admitting: Nurse Practitioner

## 2016-06-27 DIAGNOSIS — J0101 Acute recurrent maxillary sinusitis: Secondary | ICD-10-CM | POA: Diagnosis not present

## 2016-06-27 MED ORDER — FLUCONAZOLE 150 MG PO TABS: ORAL_TABLET | ORAL | 0 refills | Status: DC

## 2016-06-27 MED ORDER — AZITHROMYCIN 250 MG PO TABS: ORAL_TABLET | ORAL | 0 refills | Status: DC

## 2016-06-27 NOTE — Addendum Note (Signed)
Addended by: Chevis Pretty on: 06/27/2016 08:15 PM   Modules accepted: Orders

## 2016-06-27 NOTE — Progress Notes (Signed)
We are sorry that you are not feeling well.  Here is how we plan to help!  Based on what you have shared with me it looks like you have sinusitis.  Sinusitis is inflammation and infection in the sinus cavities of the head.  Based on your presentation I believe you most likely have Acute Bacterial sinusitis.  This is an infection caused by bacteria and is treated with antibiotics.  I have prescribed z paK, an antibiotic in the macrolide family, to be taken as directed.  You may use an oral decongestant such as Mucinex D or if you have glaucoma or high blood pressure use plain Mucinex.  Saline nasal sprays help an can sefely be used as often as needed for congestion.  If you develop worsening sinus pain, fever or notice severe headache and vision changes, or if symptoms are not better after completion of antibiotic, please schedule an appointment with a health care provider.  Sinus infections are not as easily transmitted as other respiratory infection, however we still recommend that you avoid close contact with loved ones, especially the very young and elderly.  Remember to wash your hands thoroughly throughout the day as this is the number one way to prevent the spread of infection!  Home Care:  Only take medications as instructed by your medical team.  Complete the entire course of an antibiotic.  Do not take these medications with alcohol.  A steam or ultrasonic humidifier can help congestion.  You can place a towel over your head and breathe in the steam from hot water coming from a faucet.  Avoid close contacts especially the very young and the elderly.  Cover your mouth when you cough or sneeze.  Always remember to wash your hands.  Get Help Right Away If:  You develop worsening fever or sinus pain.  You develop a severe head ache or visual changes.  Your symptoms persist after you have completed your treatment plan.  Make sure you  Understand these instructions.  Will watch  your condition.  Will get help right away if you are not doing well or get worse.  Your e-visit answers were reviewed by a board certified advanced clinical practitioner to complete your personal care plan.  Depending on the condition, your plan could have included both over the counter or prescription medications.  If there is a problem please reply  once you have received a response from your provider.  Your safety is important to [place].  If you have drug allergies check your prescription carefully.    You can use MyChart to ask questions about today's visit, request a non-urgent call back, or ask for a work or school excuse.  You will get an e-mail in the next two days asking about your experience.  I hope that your e-visit has been valuable and will speed your recovery. Thank you for using e-visits.

## 2016-07-23 DIAGNOSIS — N840 Polyp of corpus uteri: Secondary | ICD-10-CM | POA: Diagnosis not present

## 2016-07-23 DIAGNOSIS — N920 Excessive and frequent menstruation with regular cycle: Secondary | ICD-10-CM | POA: Diagnosis not present

## 2016-07-23 DIAGNOSIS — Z1231 Encounter for screening mammogram for malignant neoplasm of breast: Secondary | ICD-10-CM | POA: Diagnosis not present

## 2016-07-29 ENCOUNTER — Other Ambulatory Visit: Payer: Self-pay | Admitting: Obstetrics and Gynecology

## 2016-07-29 DIAGNOSIS — R928 Other abnormal and inconclusive findings on diagnostic imaging of breast: Secondary | ICD-10-CM

## 2016-08-05 MED FILL — traZODone HCL 50 MG TABS: 50 | 30 days supply | Qty: 30 | Fill #0

## 2016-08-06 ENCOUNTER — Ambulatory Visit
Admission: RE | Admit: 2016-08-06 | Discharge: 2016-08-06 | Disposition: A | Discharge status: Home / Self Care | Payer: 59 | Source: Ambulatory Visit | Attending: Obstetrics and Gynecology | Admitting: Obstetrics and Gynecology

## 2016-08-06 DIAGNOSIS — R928 Other abnormal and inconclusive findings on diagnostic imaging of breast: Secondary | ICD-10-CM

## 2016-08-06 DIAGNOSIS — R922 Inconclusive mammogram: Secondary | ICD-10-CM | POA: Diagnosis not present

## 2016-08-07 ENCOUNTER — Telehealth: Payer: 59 | Admitting: Physician Assistant

## 2016-08-07 DIAGNOSIS — R509 Fever, unspecified: Secondary | ICD-10-CM

## 2016-08-07 DIAGNOSIS — Z20818 Contact with and (suspected) exposure to other bacterial communicable diseases: Secondary | ICD-10-CM

## 2016-08-07 NOTE — Progress Notes (Signed)
Based on what you shared with me it looks like you have a serious condition that should be evaluated in a face to face office visit. I would agree that there is significant concern for a strep throat infection. However we are not allowed to diagnose strep via an e-visit without you having an examination and throat swabbing. Please go to an Urgent Care or see your Primary Care provider. You will not be charged for the e-visit.  If you are having a true medical emergency please call 911.  If you need an urgent face to face visit, [place] has four urgent care centers for your convenience.  If you need care fast and have a high deductible or no insurance consider:   [email or web address]  [phone]  [address], [place], [place] [address] 8 am to 8 pm Monday-Friday 10 am to 4 pm Saturday-Sunday   The following sites will take your  insurance:    . [facility] [place] a Provider at this Location  [address] [place], [place] [address] . 10 am to 8 pm Monday-Friday . 12 pm to 8 pm Saturday-Sunday   . [facility] [place] a Provider at this Location  [place], [place], [place] [address] . 8 am to 8 pm Monday-Friday . 9 am to 6 pm Saturday . 11 am to 6 pm Sunday   . [facility] at [place] Get Driving Directions  [address] [place], [place] [address] . 8 am to 8 pm Monday-Friday . 8 am to 4 pm Saturday-Sunday   . [facility] (walk-ins welcome, or call for a scheduled time)  [phone]  Get Driving Directions Find a Provider at this Location  [place], [place] [address] . 8 am to 8:30 pm Monday-Thursday . 8 am to 6 pm Friday . 8 am to 4 pm Saturday-Sunday   Your e-visit answers were reviewed by a board certified  advanced clinical practitioner to complete your personal care plan.  Thank you for using e-Visits.

## 2016-08-08 ENCOUNTER — Other Ambulatory Visit: Payer: Self-pay | Admitting: Family Medicine

## 2016-08-08 ENCOUNTER — Encounter: Payer: Self-pay | Admitting: Family Medicine

## 2016-08-08 ENCOUNTER — Other Ambulatory Visit: Payer: Self-pay | Admitting: *Deleted

## 2016-08-08 MED ORDER — FLUCONAZOLE 150 MG PO TABS: ORAL_TABLET | ORAL | 0 refills | Status: DC

## 2016-09-16 ENCOUNTER — Ambulatory Visit: Payer: 59 | Admitting: Family Medicine

## 2016-10-28 DIAGNOSIS — Z124 Encounter for screening for malignant neoplasm of cervix: Secondary | ICD-10-CM | POA: Diagnosis not present

## 2016-10-28 DIAGNOSIS — R87615 Unsatisfactory cytologic smear of cervix: Secondary | ICD-10-CM | POA: Diagnosis not present

## 2016-10-28 DIAGNOSIS — Z3202 Encounter for pregnancy test, result negative: Secondary | ICD-10-CM | POA: Diagnosis not present

## 2016-10-28 DIAGNOSIS — N840 Polyp of corpus uteri: Secondary | ICD-10-CM | POA: Diagnosis not present

## 2016-10-28 DIAGNOSIS — N920 Excessive and frequent menstruation with regular cycle: Secondary | ICD-10-CM | POA: Diagnosis not present

## 2016-10-28 MED FILL — IBUPROFEN 600 MG TABLET: 600 | 5 days supply | Qty: 20 | Fill #0

## 2016-10-28 MED FILL — OXYCODONE W/APAP 5/325 TAB: 5-325 | 7 days supply | Qty: 30 | Fill #0

## 2016-10-28 MED FILL — ONDANSETRON ODT 8 MG TABLET: 8 | 2 days supply | Qty: 4 | Fill #0

## 2016-10-31 ENCOUNTER — Ambulatory Visit (INDEPENDENT_AMBULATORY_CARE_PROVIDER_SITE_OTHER): Payer: 59 | Admitting: Family Medicine

## 2016-10-31 ENCOUNTER — Encounter: Payer: Self-pay | Admitting: Family Medicine

## 2016-10-31 VITALS — BP 132/86 | Temp 98.5°F | Ht 64.0 in | Wt 155.0 lb

## 2016-10-31 DIAGNOSIS — J111 Influenza due to unidentified influenza virus with other respiratory manifestations: Secondary | ICD-10-CM | POA: Diagnosis not present

## 2016-10-31 MED ORDER — OSELTAMIVIR PHOSPHATE 75 MG PO CAPS
75.0000 mg | ORAL_CAPSULE | Freq: Two times a day (BID) | ORAL | 0 refills | Status: DC
Start: 2016-10-31 — End: 2017-05-28

## 2016-10-31 NOTE — Progress Notes (Signed)
   Subjective:    Patient ID: SLOANE PALMER, female    DOB: Jan 11, 1976, 41 y.o.   MRN: [id number]  Sinusitis  This is a new problem. Episode onset: one day. Associated symptoms include headaches. (Fever, body aches)   Had a procedure done this week and was told she may run fever afterwards. 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   sTARTED uterin ablation  Last night dev low gr fever  And achey in the jints and musles   h a  Felt congested took iburofen   No   No cough   No sig stiffunes or dranage  Energy not the best   Slept four   Got the flu shot     Review of Systems  Neurological: Positive for headaches.       Objective:   Physical Exam Alert vitals reviewed, moderate malaise. Hydration good. Positive nasal congestion lungs no crackles or wheezes, no tachypnea, intermittent bronchial cough during exam heart regular rate and rhythm.        Assessment & Plan:  Impression influenza discussed at length. Petra Kuba of illness and potential sequela discussed. Plan Tamiflu prescribed if indicated and timing appropriate. Symptom care discussed. Warning signs discussed. WSL

## 2016-10-31 NOTE — Patient Instructions (Signed)
This definitely is the flu and Donna Bowen needs to avoid any exposure to patients the next several days as she gets started on her flu medicine  Mickie Hillier MD

## 2016-11-05 DIAGNOSIS — H5213 Myopia, bilateral: Secondary | ICD-10-CM | POA: Diagnosis not present

## 2017-05-28 ENCOUNTER — Ambulatory Visit (INDEPENDENT_AMBULATORY_CARE_PROVIDER_SITE_OTHER): Payer: PRIVATE HEALTH INSURANCE | Admitting: Family Medicine

## 2017-05-28 ENCOUNTER — Encounter: Payer: Self-pay | Admitting: Family Medicine

## 2017-05-28 DIAGNOSIS — F5101 Primary insomnia: Secondary | ICD-10-CM | POA: Diagnosis not present

## 2017-05-28 DIAGNOSIS — G4700 Insomnia, unspecified: Secondary | ICD-10-CM | POA: Insufficient documentation

## 2017-05-28 MED ORDER — TRAZODONE HCL 50 MG PO TABS: ORAL_TABLET | ORAL | 11 refills | Status: DC

## 2017-05-28 NOTE — Progress Notes (Signed)
   Subjective:    Patient ID: Donna Bowen, female    DOB: [birth year], 41 y.o.   MRN: [id number]  HPI Patient arrives for a follow up on insomnia-needs refill of trazodone.  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 No negative side effects as far as patient knows. Definitely would like to get back on. Review of Systems No headache, no major weight loss or weight gain, no chest pain no back pain abdominal pain no change in bowel habits complete ROS otherwise negative     Objective:   Physical Exam  Alert vitals stable, NAD. Blood pressure good on repeat. HEENT normal. Lungs clear. Heart regular rate and rhythm.       Assessment & Plan:  Impression insomnia. Discussed at length. Recommended maintaining trazodone. Rationale discussed. Exercise also encourages potential benefit

## 2018-01-28 ENCOUNTER — Other Ambulatory Visit: Payer: Self-pay | Admitting: Family Medicine

## 2018-01-29 NOTE — Telephone Encounter (Signed)
This +2 refills needs follow-up office visit

## 2018-09-30 ENCOUNTER — Other Ambulatory Visit: Payer: Self-pay | Admitting: Family Medicine

## 2018-09-30 NOTE — Telephone Encounter (Signed)
Not seen 16 mo, ok times one, on it writes needs o v before further

## 2018-12-16 DIAGNOSIS — M62830 Muscle spasm of back: Secondary | ICD-10-CM | POA: Diagnosis not present

## 2018-12-16 DIAGNOSIS — M9901 Segmental and somatic dysfunction of cervical region: Secondary | ICD-10-CM | POA: Diagnosis not present

## 2018-12-16 DIAGNOSIS — M9902 Segmental and somatic dysfunction of thoracic region: Secondary | ICD-10-CM | POA: Diagnosis not present

## 2018-12-16 DIAGNOSIS — M5412 Radiculopathy, cervical region: Secondary | ICD-10-CM | POA: Diagnosis not present

## 2018-12-29 DIAGNOSIS — Z124 Encounter for screening for malignant neoplasm of cervix: Secondary | ICD-10-CM | POA: Diagnosis not present

## 2018-12-29 DIAGNOSIS — Z1389 Encounter for screening for other disorder: Secondary | ICD-10-CM | POA: Diagnosis not present

## 2018-12-29 DIAGNOSIS — Z01419 Encounter for gynecological examination (general) (routine) without abnormal findings: Secondary | ICD-10-CM | POA: Diagnosis not present

## 2018-12-29 DIAGNOSIS — Z6829 Body mass index (BMI) 29.0-29.9, adult: Secondary | ICD-10-CM | POA: Diagnosis not present

## 2018-12-29 DIAGNOSIS — R69 Illness, unspecified: Secondary | ICD-10-CM | POA: Diagnosis not present

## 2018-12-29 DIAGNOSIS — Z0000 Encounter for general adult medical examination without abnormal findings: Secondary | ICD-10-CM | POA: Diagnosis not present

## 2018-12-29 DIAGNOSIS — Z130 Encounter for screening for diseases of the blood and blood-forming organs and certain disorders involving the immune mechanism: Secondary | ICD-10-CM | POA: Diagnosis not present

## 2019-01-06 DIAGNOSIS — M5412 Radiculopathy, cervical region: Secondary | ICD-10-CM | POA: Diagnosis not present

## 2019-01-06 DIAGNOSIS — M9901 Segmental and somatic dysfunction of cervical region: Secondary | ICD-10-CM | POA: Diagnosis not present

## 2019-01-06 DIAGNOSIS — M9902 Segmental and somatic dysfunction of thoracic region: Secondary | ICD-10-CM | POA: Diagnosis not present

## 2019-01-06 DIAGNOSIS — M62830 Muscle spasm of back: Secondary | ICD-10-CM | POA: Diagnosis not present

## 2019-02-28 DIAGNOSIS — M9902 Segmental and somatic dysfunction of thoracic region: Secondary | ICD-10-CM | POA: Diagnosis not present

## 2019-02-28 DIAGNOSIS — M5412 Radiculopathy, cervical region: Secondary | ICD-10-CM | POA: Diagnosis not present

## 2019-02-28 DIAGNOSIS — M9901 Segmental and somatic dysfunction of cervical region: Secondary | ICD-10-CM | POA: Diagnosis not present

## 2019-02-28 DIAGNOSIS — M62830 Muscle spasm of back: Secondary | ICD-10-CM | POA: Diagnosis not present

## 2019-04-02 ENCOUNTER — Other Ambulatory Visit: Payer: Self-pay | Admitting: Family Medicine

## 2019-04-04 NOTE — Telephone Encounter (Signed)
Call and sched appt, then may re times one

## 2019-04-05 NOTE — Telephone Encounter (Signed)
lvm to call and schedule virtual visit

## 2019-04-06 DIAGNOSIS — M5412 Radiculopathy, cervical region: Secondary | ICD-10-CM | POA: Diagnosis not present

## 2019-04-06 DIAGNOSIS — M9902 Segmental and somatic dysfunction of thoracic region: Secondary | ICD-10-CM | POA: Diagnosis not present

## 2019-04-06 DIAGNOSIS — M9901 Segmental and somatic dysfunction of cervical region: Secondary | ICD-10-CM | POA: Diagnosis not present

## 2019-04-06 DIAGNOSIS — M62830 Muscle spasm of back: Secondary | ICD-10-CM | POA: Diagnosis not present

## 2019-04-13 NOTE — Telephone Encounter (Signed)
[facility] - need to schedule appt

## 2019-04-19 NOTE — Telephone Encounter (Signed)
Left message to schedule appointment

## 2019-04-20 DIAGNOSIS — M62830 Muscle spasm of back: Secondary | ICD-10-CM | POA: Diagnosis not present

## 2019-04-20 DIAGNOSIS — M9902 Segmental and somatic dysfunction of thoracic region: Secondary | ICD-10-CM | POA: Diagnosis not present

## 2019-04-20 DIAGNOSIS — M9901 Segmental and somatic dysfunction of cervical region: Secondary | ICD-10-CM | POA: Diagnosis not present

## 2019-04-20 DIAGNOSIS — M5412 Radiculopathy, cervical region: Secondary | ICD-10-CM | POA: Diagnosis not present

## 2019-04-27 DIAGNOSIS — M9901 Segmental and somatic dysfunction of cervical region: Secondary | ICD-10-CM | POA: Diagnosis not present

## 2019-04-27 DIAGNOSIS — M62830 Muscle spasm of back: Secondary | ICD-10-CM | POA: Diagnosis not present

## 2019-04-27 DIAGNOSIS — M5412 Radiculopathy, cervical region: Secondary | ICD-10-CM | POA: Diagnosis not present

## 2019-04-27 DIAGNOSIS — M9902 Segmental and somatic dysfunction of thoracic region: Secondary | ICD-10-CM | POA: Diagnosis not present

## 2019-04-27 NOTE — Telephone Encounter (Signed)
Pt states refill has been taken care of and will call when she needs a visit

## 2019-05-04 DIAGNOSIS — M9901 Segmental and somatic dysfunction of cervical region: Secondary | ICD-10-CM | POA: Diagnosis not present

## 2019-05-04 DIAGNOSIS — M5412 Radiculopathy, cervical region: Secondary | ICD-10-CM | POA: Diagnosis not present

## 2019-05-04 DIAGNOSIS — M62830 Muscle spasm of back: Secondary | ICD-10-CM | POA: Diagnosis not present

## 2019-05-04 DIAGNOSIS — M9902 Segmental and somatic dysfunction of thoracic region: Secondary | ICD-10-CM | POA: Diagnosis not present

## 2019-06-15 DIAGNOSIS — Z23 Encounter for immunization: Secondary | ICD-10-CM | POA: Diagnosis not present

## 2019-06-28 DIAGNOSIS — M5412 Radiculopathy, cervical region: Secondary | ICD-10-CM | POA: Diagnosis not present

## 2019-06-28 DIAGNOSIS — M62830 Muscle spasm of back: Secondary | ICD-10-CM | POA: Diagnosis not present

## 2019-06-28 DIAGNOSIS — M9902 Segmental and somatic dysfunction of thoracic region: Secondary | ICD-10-CM | POA: Diagnosis not present

## 2019-06-28 DIAGNOSIS — M9901 Segmental and somatic dysfunction of cervical region: Secondary | ICD-10-CM | POA: Diagnosis not present

## 2019-10-10 ENCOUNTER — Encounter: Payer: Self-pay | Admitting: Family Medicine

## 2022-06-25 ENCOUNTER — Ambulatory Visit: Payer: 59 | Admitting: Family Medicine

## 2022-08-14 ENCOUNTER — Encounter: Payer: Self-pay | Admitting: Orthopedic Surgery

## 2022-08-14 ENCOUNTER — Ambulatory Visit (INDEPENDENT_AMBULATORY_CARE_PROVIDER_SITE_OTHER): Payer: PRIVATE HEALTH INSURANCE | Admitting: Orthopedic Surgery

## 2022-08-14 VITALS — BP 120/80 | HR 78 | Temp 97.5°F | Resp 18 | Ht 65.0 in | Wt 175.1 lb

## 2022-08-14 DIAGNOSIS — Z1231 Encounter for screening mammogram for malignant neoplasm of breast: Secondary | ICD-10-CM

## 2022-08-14 DIAGNOSIS — E559 Vitamin D deficiency, unspecified: Secondary | ICD-10-CM

## 2022-08-14 DIAGNOSIS — Z1211 Encounter for screening for malignant neoplasm of colon: Secondary | ICD-10-CM | POA: Diagnosis not present

## 2022-08-14 DIAGNOSIS — Z6829 Body mass index (BMI) 29.0-29.9, adult: Secondary | ICD-10-CM | POA: Diagnosis not present

## 2022-08-14 HISTORY — DX: Vitamin D deficiency, unspecified: E55.9

## 2022-08-14 HISTORY — DX: Hypocalcemia: E83.51

## 2022-08-14 NOTE — Patient Instructions (Addendum)
Please schedule PAP within next year  Mammogram ordered with [place] referral made with [facility]
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
## 2022-08-25 ENCOUNTER — Encounter: Payer: Self-pay | Admitting: Gastroenterology

## 2022-08-29 ENCOUNTER — Ambulatory Visit: Payer: No Typology Code available for payment source | Admitting: Adult Health

## 2022-08-29 ENCOUNTER — Encounter: Payer: Self-pay | Admitting: Adult Health

## 2022-08-29 VITALS — BP 118/80 | HR 82 | Temp 97.1°F | Resp 18 | Ht 65.0 in | Wt 178.0 lb

## 2022-08-29 DIAGNOSIS — K257 Chronic gastric ulcer without hemorrhage or perforation: Secondary | ICD-10-CM

## 2022-08-29 MED ORDER — PANTOPRAZOLE SODIUM 40 MG PO TBEC: 40.0000 mg | DELAYED_RELEASE_TABLET | Freq: Every day | ORAL | 3 refills | Status: DC

## 2022-08-29 MED ORDER — SUCRALFATE 1 G PO TABS: 1.0000 g | ORAL_TABLET | Freq: Three times a day (TID) | ORAL | 0 refills | Status: DC

## 2022-08-29 NOTE — Progress Notes (Signed)
[facility] clinic  Provider:  Durenda Age DNP  Code Status:  Full Code  Goals of Care:     08/14/2022    9:12 AM  Advanced Directives  Does Patient Have a Medical Advance Directive? No  Would patient like information on creating a medical advance directive? No - Patient declined     Chief Complaint  Patient presents with   Acute Visit    Patient complains of stomach ulcers needs referral to GI    HPI: Patient is a 46 y.o. female seen today for an acute visit for needing a referral to GI. She had same kind of burning abdominal pain, mid upper quadrant. She said drinking coffee and spicy foods makes it worse. She denies vomiting blood.  She drinks rum and coke on weekends, approx 2 oz. She stopped smoking cigarettes but does vaping on and off.   Past Medical History:  Diagnosis Date   ADD (attention deficit disorder)    Adult   Hypocalcemia 08/14/2022   Vitamin D deficiency 08/14/2022    Past Surgical History:  Procedure Laterality Date   ANTERIOR CERVICAL DECOMP/DISCECTOMY FUSION     Last year   Maxillofacial surgery      20 years ago    No Known Allergies  Outpatient Encounter Medications as of 08/29/2022  Medication Sig   sucralfate (CARAFATE) 1 g tablet Take 1 tablet (1 g total) by mouth 4 (four) times daily -  with meals and at bedtime.   [DISCONTINUED] pantoprazole (PROTONIX) 40 MG tablet Take 1 tablet (40 mg total) by mouth daily.   No facility-administered encounter medications on file as of 08/29/2022.    Review of Systems:  Review of Systems  Constitutional:  Negative for appetite change, chills, fatigue and fever.  HENT:  Negative for congestion, hearing loss, rhinorrhea and sore throat.   Eyes: Negative.   Respiratory:  Negative for cough, shortness of breath and wheezing.   Cardiovascular:  Negative for chest pain, palpitations and leg swelling.  Gastrointestinal:  Negative for abdominal pain, constipation, diarrhea, nausea and vomiting.   Genitourinary:  Negative for dysuria.       Abdominal pain since March 2023  Musculoskeletal:  Negative for arthralgias, back pain and myalgias.  Skin:  Negative for color change, rash and wound.  Neurological:  Negative for dizziness, weakness and headaches.  Psychiatric/Behavioral:  Negative for behavioral problems. The patient is not nervous/anxious.     Health Maintenance  Topic Date Due   HIV Screening  Never done   Hepatitis C Screening  Never done   DTaP/Tdap/Td (1 - Tdap) Never done   COLONOSCOPY (Pts 45-54yr Insurance coverage will need to be confirmed)  Never done   PAP SMEAR-Modifier  12/26/2021   INFLUENZA VACCINE  Completed   COVID-19 Vaccine  Completed   HPV VACCINES  Aged Out    Physical Exam: Vitals:   08/29/22 0932  BP: 118/80  Pulse: 82  Resp: 18  Temp: (!) 97.1 F (36.2 C)  SpO2: 99%  Weight: 178 lb (80.7 kg)  Height: '5\' 5"'$  (1.651 m)   Body mass index is 29.62 kg/m. Physical Exam Constitutional:      General: She is not in acute distress.    Appearance: Normal appearance.  HENT:     Head: Normocephalic and atraumatic.     Nose: Nose normal.     Mouth/Throat:     Mouth: Mucous membranes are moist.  Eyes:     Conjunctiva/sclera: Conjunctivae normal.  Cardiovascular:  Rate and Rhythm: Normal rate and regular rhythm.  Pulmonary:     Effort: Pulmonary effort is normal.     Breath sounds: Normal breath sounds.  Abdominal:     General: Bowel sounds are normal.     Palpations: Abdomen is soft.  Musculoskeletal:        General: Normal range of motion.     Cervical back: Normal range of motion.  Skin:    General: Skin is warm and dry.  Neurological:     General: No focal deficit present.     Mental Status: She is alert and oriented to person, place, and time.  Psychiatric:        Mood and Affect: Mood normal.        Behavior: Behavior normal.        Thought Content: Thought content normal.        Judgment: Judgment normal.     Labs  reviewed: Basic Metabolic Panel: No results for input(s): "NA", "K", "CL", "CO2", "GLUCOSE", "BUN", "CREATININE", "CALCIUM", "MG", "PHOS", "TSH" in the last 8760 hours. Liver Function Tests: No results for input(s): "AST", "ALT", "ALKPHOS", "BILITOT", "PROT", "ALBUMIN" in the last 8760 hours. No results for input(s): "LIPASE", "AMYLASE" in the last 8760 hours. No results for input(s): "AMMONIA" in the last 8760 hours. CBC: No results for input(s): "WBC", "NEUTROABS", "HGB", "HCT", "MCV", "PLT" in the last 8760 hours. Lipid Panel: No results for input(s): "CHOL", "HDL", "LDLCALC", "TRIG", "CHOLHDL", "LDLDIRECT" in the last 8760 hours. No results found for: "HGBA1C"  Procedures since last visit: No results found.  Assessment/Plan  1. Chronic gastric ulcer, unspecified whether gastric ulcer hemorrhage or perforation present - Ambulatory referral to Gastroenterology - sucralfate (CARAFATE) 1 g tablet; Take 1 tablet (1 g total) by mouth 4 (four) times daily -  with meals and at bedtime.  Dispense: 120 tablet; Refill: 0    Labs/tests ordered:  None  Next appt:  Visit date not found

## 2022-08-29 NOTE — Patient Instructions (Signed)
Peptic Ulcer  A peptic ulcer is a sore in the lining of the stomach (gastric ulcer) or the first part of the small intestine (duodenal ulcer). The ulcer causes a gradual wearing away (erosion) of the deeper tissue. What are the causes? Normally, the lining of the stomach and the small intestine protects them from the acid that digests food. The protective lining can be damaged by: An infection caused by a type of bacteria called Helicobacter pylori or H. pylori. Regular use of NSAIDs, such as ibuprofen or aspirin. Rare tumors in the stomach, small intestine, or pancreas (Zollinger-Ellison syndrome). What increases the risk? The following factors may make you more likely to develop this condition: Smoking. Having a family history of ulcer disease. Drinking alcohol. Having been hospitalized in an intensive care unit (ICU). What are the signs or symptoms? Symptoms of this condition include: Persistent burning pain in the area between the chest and the belly button. The pain may be worse on an empty stomach and at night. Heartburn. Nausea and vomiting. Bloating. If the ulcer results in bleeding, it can cause: Black, tarry stools. Vomiting of bright red blood. Vomiting of material that looks like coffee grounds. How is this diagnosed? This condition may be diagnosed based on: Your medical history and a physical exam. Various tests or procedures, such as: Upper endoscopy. The health care provider examines the esophagus, stomach, and small intestine using a small flexible tube that has a video camera at the end. Blood tests, stool tests, or breath tests to check for the H. pylori bacteria. An X-ray exam (upper gastrointestinal series) of the esophagus, stomach, and small intestine. A biopsy to help find certain causes of ulcers. A tissue sample is removed during upper endoscopy to be examined under a microscope. How is this treated? Treatment for this condition may include: Eliminating the  cause of the ulcer, such as smoking or use of NSAIDs, and limiting alcohol and caffeine intake. Medicines to reduce the amount of acid in your digestive tract. Antibiotic medicines, if the ulcer is caused by an H. pylori infection. An upper endoscopy may be used to treat a bleeding ulcer. Surgery. This may be needed if the bleeding is severe or if the ulcer created a hole somewhere in the digestive system. Follow these instructions at home: Do not drink alcohol if your health care provider tells you not to drink. Do not use any products that contain nicotine or tobacco. These products include cigarettes, chewing tobacco, and vaping devices, such as e-cigarettes. If you need help quitting, ask your health care provider. Take over-the-counter and prescription medicines only as told by your health care provider. Do not use over-the-counter medicines in place of prescription medicines unless your health care provider approves. Do not take aspirin, ibuprofen, or other NSAIDs unless your health care provider tells you to. Keep all follow-up visits. This is important. Contact a health care provider if: Your symptoms do not improve within 7 days of starting treatment. You have ongoing indigestion or heartburn. Get help right away if: You have sudden, sharp, or persistent pain in your abdomen. You have bloody or dark black, tarry stools. You vomit blood or material that looks like coffee grounds. You become light-headed or you feel faint. You become weak. You become sweaty or clammy. These symptoms may be an emergency. Get help right away. Call 911. Do not wait to see if the symptoms will go away. Do not drive yourself to the hospital. Summary A peptic ulcer is a sore  in the lining of the stomach (gastric ulcer) or the first part of the small intestine (duodenal ulcer). The ulcer causes a gradual wearing away (erosion) of the deeper tissue. Do not use any products that contain nicotine or tobacco.  These products include cigarettes, chewing tobacco, and vaping devices, such as e-cigarettes. If you need help quitting, ask your health care provider. Take over-the-counter and prescription medicines only as told by your health care provider. Do not use over-the-counter medicines in place of prescription medicines unless your health care provider approves. Limit your alcohol and caffeine intake. Keep all follow-up visits. This is important. This information is not intended to replace advice given to you by your health care provider. Make sure you discuss any questions you have with your health care provider. Document Revised: 04/05/2021 Document Reviewed: 04/05/2021 Elsevier Patient Education  [place].

## 2022-09-04 ENCOUNTER — Telehealth: Payer: Self-pay | Admitting: *Deleted

## 2022-09-04 ENCOUNTER — Ambulatory Visit (AMBULATORY_SURGERY_CENTER): Payer: No Typology Code available for payment source | Admitting: *Deleted

## 2022-09-04 VITALS — Ht 65.0 in | Wt 170.0 lb

## 2022-09-04 DIAGNOSIS — Z1211 Encounter for screening for malignant neoplasm of colon: Secondary | ICD-10-CM

## 2022-09-04 MED ORDER — NA SULFATE-K SULFATE-MG SULF 17.5-3.13-1.6 GM/177ML PO SOLN: 1.0000 | Freq: Once | ORAL | 0 refills | Status: AC

## 2022-09-04 NOTE — Telephone Encounter (Signed)
1st call:Unable to reach patient. LM with # for call back. Will attempt again in 5 min 2nd call 5 min later: Unable to reach patient no other # listed to reach them. LM with # for call back. Informed patient to call back by 5pm or procedure will be canceled.

## 2022-09-04 NOTE — Progress Notes (Signed)
No egg or soy allergy known to patient  No issues known to pt with past sedation with any surgeries or procedures Patient denies ever being told they had issues or difficulty with intubation  No FH of Malignant Hyperthermia Pt is not on diet pills Pt is not on  home 02  Pt is not on blood thinners  Pt denies issues with constipation At times per pt.  Pt is not on dialysis Pt denies any upcoming cardiac testing Pt encouraged to use to use Singlecare or Goodrx to reduce cost  Patient's chart reviewed by Osvaldo Angst CNRA prior to previsit and patient appropriate for the [place].  Previsit completed and red dot placed by patient's name on their procedure day (on provider's schedule).  . Pre-visit done by phone Instructions sent by mail with coupon

## 2022-09-24 ENCOUNTER — Encounter: Payer: Self-pay | Admitting: Gastroenterology

## 2022-09-25 ENCOUNTER — Encounter: Payer: Self-pay | Admitting: Gastroenterology

## 2022-09-25 ENCOUNTER — Ambulatory Visit (AMBULATORY_SURGERY_CENTER): Payer: No Typology Code available for payment source | Admitting: Gastroenterology

## 2022-09-25 ENCOUNTER — Other Ambulatory Visit: Payer: Self-pay | Admitting: Orthopedic Surgery

## 2022-09-25 ENCOUNTER — Other Ambulatory Visit: Payer: Self-pay | Admitting: Adult Health

## 2022-09-25 VITALS — BP 125/72 | HR 70 | Temp 96.6°F | Resp 11 | Ht 65.0 in | Wt 170.0 lb

## 2022-09-25 DIAGNOSIS — K635 Polyp of colon: Secondary | ICD-10-CM | POA: Diagnosis not present

## 2022-09-25 DIAGNOSIS — K257 Chronic gastric ulcer without hemorrhage or perforation: Secondary | ICD-10-CM

## 2022-09-25 DIAGNOSIS — Z1211 Encounter for screening for malignant neoplasm of colon: Secondary | ICD-10-CM

## 2022-09-25 DIAGNOSIS — D125 Benign neoplasm of sigmoid colon: Secondary | ICD-10-CM

## 2022-09-25 MED ORDER — SODIUM CHLORIDE 0.9 % IV SOLN: 500.0000 mL | Freq: Once | INTRAVENOUS | Status: DC

## 2022-09-25 NOTE — Progress Notes (Signed)
Vitals-DT  Pt's states no medical or surgical changes since previsit or office visit.  

## 2022-09-25 NOTE — Progress Notes (Signed)
History & Physical  Primary Care Physician:  Yvonna Alanis, NP Primary Gastroenterologist: Lucio Edward, MD  CHIEF COMPLAINT:  CRC screening  HPI: Donna Bowen is a 47 y.o. female for CRC screening, average risk, with colonoscopy.   Past Medical History:  Diagnosis Date   ADD (attention deficit disorder)    Adult   Hypocalcemia 08/14/2022   Vitamin D deficiency 08/14/2022    Past Surgical History:  Procedure Laterality Date   ANTERIOR CERVICAL DECOMP/DISCECTOMY FUSION     Last year   Maxillofacial surgery      20 years ago    Prior to Admission medications   Medication Sig Start Date End Date Taking? Authorizing Provider  sucralfate (CARAFATE) 1 g tablet TAKE 1 TABLET(1 GRAM) BY MOUTH FOUR TIMES DAILY AT BEDTIME WITH MEALS 09/25/22  Yes [place], Amy E, NP    Current Outpatient Medications  Medication Sig Dispense Refill   sucralfate (CARAFATE) 1 g tablet TAKE 1 TABLET(1 GRAM) BY MOUTH FOUR TIMES DAILY AT BEDTIME WITH MEALS 120 tablet 0   Current Facility-Administered Medications  Medication Dose Route Frequency Provider Last Rate Last Admin   0.9 %  sodium chloride infusion  500 mL Intravenous Once Ladene Artist, MD        Allergies as of 09/25/2022   (No Known Allergies)    Family History  Adopted: Yes  Problem Relation Age of Onset   Osteoporosis Mother    Diabetes Maternal Grandmother    Dementia Maternal Grandfather    Heart disease Maternal Grandfather    Colon cancer Neg Hx    Colon polyps Neg Hx    Esophageal cancer Neg Hx    Stomach cancer Neg Hx    Rectal cancer Neg Hx     Social History   Socioeconomic History   Marital status: Married    Spouse name: Not on file   Number of children: Not on file   Years of education: Not on file   Highest education level: Not on file  Occupational History   Not on file  Tobacco Use   Smoking status: Former   Smokeless tobacco: Former  Scientific laboratory technician Use: Former  Substance and Sexual  Activity   Alcohol use: Yes    Comment: occ   Drug use: Never   Sexual activity: Not on file  Other Topics Concern   Not on file  Social History Narrative   Not on file   Social Determinants of Health   Financial Resource Strain: Not on file  Food Insecurity: Not on file  Transportation Needs: Not on file  Physical Activity: Not on file  Stress: Not on file  Social Connections: Not on file  Intimate Partner Violence: Not on file    Review of Systems:  All systems reviewed were negative except where noted in HPI.   Physical Exam: General:  Alert, well-developed, in NAD Head:  Normocephalic and atraumatic. Eyes:  Sclera clear, no icterus.   Conjunctiva pink. Ears:  Normal auditory acuity. Mouth:  No deformity or lesions.  Neck:  Supple; no masses . Lungs:  Clear throughout to auscultation.   No wheezes, crackles, or rhonchi. No acute distress. Heart:  Regular rate and rhythm; no murmurs. Abdomen:  Soft, nondistended, nontender. No masses, hepatomegaly. No obvious masses.  Normal bowel .    Rectal:  Deferred   Msk:  Symmetrical without gross deformities.. Pulses:  Normal pulses noted. Extremities:  Without edema. Neurologic:  Alert and  oriented x4;  grossly normal neurologically. Skin:  Intact without significant lesions or rashes. Psych:  Alert and cooperative. Normal mood and affect.   Impression / Plan:   CRC screening, average risk, for colonoscopy.  Pricilla Riffle. [place] Plan  09/25/2022, 9:36 AM See Shea Evans, [facility], to contact our on call provider

## 2022-09-25 NOTE — Progress Notes (Signed)
Called to room to assist during endoscopic procedure.  Patient ID and intended procedure confirmed with present staff. Received instructions for my participation in the procedure from the performing physician.

## 2022-09-25 NOTE — Patient Instructions (Signed)
Resume previous diet and medications. Awaiting pathology results. Take Miralax as needed to prevent constipation. Handout provided on Colonoscopy, Hemorrhoids and a High Fiber Diet.  YOU HAD AN ENDOSCOPIC PROCEDURE TODAY AT [facility]:   Refer to the procedure report that was given to you for any specific questions about what was found during the examination.  If the procedure report does not answer your questions, please call your gastroenterologist to clarify.  If you requested that your care partner not be given the details of your procedure findings, then the procedure report has been included in a sealed envelope for you to review at your convenience later.  YOU SHOULD EXPECT: Some feelings of bloating in the abdomen. Passage of more gas than usual.  Walking can help get rid of the air that was put into your GI tract during the procedure and reduce the bloating. If you had a lower endoscopy (such as a colonoscopy or flexible sigmoidoscopy) you may notice spotting of blood in your stool or on the toilet paper. If you underwent a bowel prep for your procedure, you may not have a normal bowel movement for a few days.  Please Note:  You might notice some irritation and congestion in your nose or some drainage.  This is from the oxygen used during your procedure.  There is no need for concern and it should clear up in a day or so.  SYMPTOMS TO REPORT IMMEDIATELY:  Following lower endoscopy (colonoscopy or flexible sigmoidoscopy):  Excessive amounts of blood in the stool  Significant tenderness or worsening of abdominal pains  Swelling of the abdomen that is new, acute  Fever of 100F or higher  Following upper endoscopy (EGD)  Vomiting of blood or coffee ground material  New chest pain or pain under the shoulder blades  Painful or persistently difficult swallowing  New shortness of breath  Fever of 100F or higher  Black, tarry-looking stools  For urgent or emergent issues,  a gastroenterologist can be reached at any hour by calling [phone]. Do not use MyChart messaging for urgent concerns.    DIET:  We do recommend a small meal at first, but then you may proceed to your regular diet.  Drink plenty of fluids but you should avoid alcoholic beverages for 24 hours.  ACTIVITY:  You should plan to take it easy for the rest of today and you should NOT DRIVE or use heavy machinery until tomorrow (because of the sedation medicines used during the test).    FOLLOW UP: Our staff will call the number listed on your records the next business day following your procedure.  We will call around 7:15- 8:00 am to check on you and address any questions or concerns that you may have regarding the information given to you following your procedure. If we do not reach you, we will leave a message.     If any biopsies were taken you will be contacted by phone or by letter within the next 1-3 weeks.  Please call us at [phone] if you have not heard about the biopsies in 3 weeks.    SIGNATURES/CONFIDENTIALITY: You and/or your care partner have signed paperwork which will be entered into your electronic medical record.  These signatures attest to the fact that that the information above on your After Visit Summary has been reviewed and is understood.  Full responsibility of the confidentiality of this discharge information lies with you and/or your care-partner.

## 2022-09-25 NOTE — Telephone Encounter (Signed)
Patient has request refill on medication Sucralfate 1g. Patient medication last refilled 08/29/2022. Patient was given 30 day supply. Medication refill pend and sent to PCP Yvonna Alanis, NP for additional refills.

## 2022-09-25 NOTE — Progress Notes (Signed)
Report to PACU, RN, vss, BBS= Clear.  

## 2022-09-25 NOTE — Op Note (Signed)
[place] Patient Name: Donna Bowen Procedure Date: 09/25/2022 9:26 AM MRN: [id number] Endoscopist: Ladene Artist , MD, [id number] Age: 47 Referring MD:  Date of Birth: 18-Aug-1976 Gender: Female Account #: [id number] Procedure:                Colonoscopy Indications:              Screening for colorectal malignant neoplasm Medicines:                Monitored Anesthesia Care Procedure:                Pre-Anesthesia Assessment:                           - Prior to the procedure, a History and Physical                            was performed, and patient medications and                            allergies were reviewed. The patient's tolerance of                            previous anesthesia was also reviewed. The risks                            and benefits of the procedure and the sedation                            options and risks were discussed with the patient.                            All questions were answered, and informed consent                            was obtained. Prior Anticoagulants: The patient has                            taken no anticoagulant or antiplatelet agents. ASA                            Grade Assessment: II - A patient with mild systemic                            disease. After reviewing the risks and benefits,                            the patient was deemed in satisfactory condition to                            undergo the procedure.                           After obtaining informed consent, the colonoscope  was passed under direct vision. Throughout the                            procedure, the patient's blood pressure, pulse, and                            oxygen saturations were monitored continuously. The                            CF [id number] #[id number] was introduced through the anus                            and advanced to the the cecum, identified by                            appendiceal  orifice and ileocecal valve. The                            ileocecal valve, appendiceal orifice, and rectum                            were photographed. The quality of the bowel                            preparation was good. The colonoscopy was performed                            without difficulty. The patient tolerated the                            procedure well. Scope In: 9:46:32 AM Scope Out: 10:05:51 AM Scope Withdrawal Time: 0 hours 14 minutes 36 seconds  Total Procedure Duration: 0 hours 19 minutes 19 seconds  Findings:                 The perianal and digital rectal examinations were                            normal.                           A 6 mm polyp was found in the sigmoid colon. The                            polyp was sessile. The polyp was removed with a                            cold snare. Resection and retrieval were complete.                           Anal papilla(e) were hypertrophied.                           Internal hemorrhoids were found during  retroflexion. The hemorrhoids were small and Grade                            I (internal hemorrhoids that do not prolapse).                           The exam was otherwise without abnormality on                            direct and retroflexion views. Complications:            No immediate complications. Estimated blood loss:                            None. Estimated Blood Loss:     Estimated blood loss: none. Impression:               - One 6 mm polyp in the sigmoid colon, removed with                            a cold snare. Resected and retrieved.                           - Anal papilla(e) were hypertrophied.                           - Internal hemorrhoids.                           - The examination was otherwise normal on direct                            and retroflexion views. Recommendation:           - Repeat colonoscopy after studies are complete for                             surveillance based on pathology results.                           - Patient has a contact number available for                            emergencies. The signs and symptoms of potential                            delayed complications were discussed with the                            patient. Return to normal activities tomorrow.                            Written discharge instructions were provided to the                            patient.                           -  Resume previous diet.                           - Continue present medications.                           - Await pathology results. Ladene Artist, MD 09/25/2022 10:08:52 AM This report has been signed electronically.

## 2022-09-26 ENCOUNTER — Telehealth: Payer: Self-pay

## 2022-09-26 NOTE — Telephone Encounter (Signed)
  Follow up Call-     09/25/2022    8:43 AM 09/25/2022    8:33 AM  Call back number  Post procedure Call Back phone  # [phone]   Permission to leave phone message  Yes     Patient questions:  Do you have a fever, pain , or abdominal swelling? No. Pain Score  0 *  Have you tolerated food without any problems? Yes.    Have you been able to return to your normal activities? Yes.    Do you have any questions about your discharge instructions: Diet   No. Medications  No. Follow up visit  No.  Do you have questions or concerns about your Care? No.  Actions: * If pain score is 4 or above: No action needed, pain <4.

## 2022-10-01 ENCOUNTER — Encounter: Payer: No Typology Code available for payment source | Admitting: Gastroenterology

## 2022-10-09 ENCOUNTER — Encounter: Payer: Self-pay | Admitting: Gastroenterology

## 2022-10-22 ENCOUNTER — Ambulatory Visit: Payer: No Typology Code available for payment source

## 2022-10-27 ENCOUNTER — Ambulatory Visit
Admission: RE | Admit: 2022-10-27 | Discharge: 2022-10-27 | Disposition: A | Discharge status: Home / Self Care | Payer: No Typology Code available for payment source | Source: Ambulatory Visit | Attending: Orthopedic Surgery | Admitting: Orthopedic Surgery

## 2022-10-29 ENCOUNTER — Encounter: Payer: Self-pay | Admitting: Gastroenterology

## 2022-10-29 ENCOUNTER — Ambulatory Visit (INDEPENDENT_AMBULATORY_CARE_PROVIDER_SITE_OTHER): Payer: No Typology Code available for payment source | Admitting: Gastroenterology

## 2022-10-29 VITALS — BP 110/70 | HR 80 | Ht 65.0 in | Wt 182.0 lb

## 2022-10-29 DIAGNOSIS — R1013 Epigastric pain: Secondary | ICD-10-CM | POA: Diagnosis not present

## 2022-10-29 MED ORDER — OMEPRAZOLE 20 MG PO CPDR: 20.0000 mg | DELAYED_RELEASE_CAPSULE | Freq: Every day | ORAL | 2 refills | Status: DC

## 2022-10-29 NOTE — Patient Instructions (Signed)
We have sent the following medications to your pharmacy for you to pick up at your convenience: omeprazole 20 mg daily x 8 weeks.   Call our office or MyChart message our office if your symptoms are not better after the 8 weeks.   _______________________________________________________  If your blood pressure at your visit was 140/90 or greater, please contact your primary care physician to follow up on this.  _______________________________________________________  If you are age 78 or older, your body mass index should be between 23-30. Your Body mass index is 30.29 kg/m. If this is out of the aforementioned range listed, please consider follow up with your Primary Care Provider.  If you are age 58 or younger, your body mass index should be between 19-25. Your Body mass index is 30.29 kg/m. If this is out of the aformentioned range listed, please consider follow up with your Primary Care Provider.   ________________________________________________________  The [facility] providers would like to encourage you to use [facility] to communicate with providers for non-urgent requests or questions.  Due to long hold times on the telephone, sending your provider a message by [facility] may be a faster and more efficient way to get a response.  Please allow 48 business hours for a response.  Please remember that this is for non-urgent requests.  _______________________________________________________  Thank you for choosing me and [facility].  Pricilla Riffle. Dagoberto Ligas., MD., Marval Regal

## 2022-10-29 NOTE — Progress Notes (Signed)
    Assessment     Epigastric pain - R/O gastritis, GERD, ulcer CRC screening, average risk, up to date with colonoscopy    Recommendations    Omeprazole 20 mg qd for 8 weeks then discontinue If symptoms recur after 8 week PPI course recommend EGD, bloodwork to further evaluate  Screening colonoscopy recommended in January 2034   HPI    This is a 47 year old female with epigastric pain.  She relates epigastric pain for the past year.  Initially symptoms were exacerbated by meals and coffee.  She states an H. pylori antibody test was performed which was negative.  She was prescribed Carafate which relieved her symptoms.  For the past few months she has had epigastric pain after drinking coffee in the morning her symptoms are relieved with 1 Carafate a day.  She has not taken a PPI or H2 RA.  She denies substernal burning.  She occasionally has mild constipation.  Denies weight loss, diarrhea, change in stool caliber, melena, hematochezia, nausea, vomiting, dysphagia, chest pain.  Colonoscopy Jan 2024  Path: hyperplastic polyp   Labs / Imaging        No data to display         Current Medications, Allergies, Past Medical History, Past Surgical History, Family History and Social History were reviewed in [employer] record.   Physical Exam: General: Well developed, well nourished, no acute distress Head: Normocephalic and atraumatic Eyes: Sclerae anicteric, EOMI Ears: Normal auditory acuity Mouth: No deformities or lesions noted Lungs: Clear throughout to auscultation Heart: Regular rate and rhythm; No murmurs, rubs or bruits Abdomen: Soft, non tender and non distended. No masses, hepatosplenomegaly or hernias noted. Normal Bowel sounds Rectal: Not done Musculoskeletal: Symmetrical with no gross deformities  Pulses:  Normal pulses noted Extremities: No edema or deformities noted Neurological: Alert oriented x 4, grossly nonfocal Psychological:   Alert and cooperative. Normal mood and affect   Icel Castles T. Fuller Plan, MD 10/29/2022, 8:35 AM

## 2023-01-25 ENCOUNTER — Other Ambulatory Visit: Payer: Self-pay | Admitting: Gastroenterology

## 2023-04-09 ENCOUNTER — Telehealth: Payer: No Typology Code available for payment source | Admitting: Orthopedic Surgery

## 2023-04-16 ENCOUNTER — Telehealth: Payer: No Typology Code available for payment source | Admitting: Orthopedic Surgery

## 2023-04-16 ENCOUNTER — Ambulatory Visit: Payer: No Typology Code available for payment source | Admitting: Orthopedic Surgery

## 2023-04-16 ENCOUNTER — Encounter: Payer: Self-pay | Admitting: Orthopedic Surgery

## 2023-04-16 DIAGNOSIS — G4700 Insomnia, unspecified: Secondary | ICD-10-CM

## 2023-04-16 MED ORDER — TRAZODONE HCL 50 MG PO TABS: 25.0000 mg | ORAL_TABLET | Freq: Every evening | ORAL | 1 refills | Status: DC | PRN

## 2023-04-16 NOTE — Progress Notes (Signed)
   This service is provided via telemedicine  No vital signs collected/recorded due to the encounter was a telemedicine visit.   Location of patient (ex: home, work):  Home  Patient consents to a telephone visit:  Yes  Location of the provider (ex: office, home):  [employer]  Name of any referring provider: Yvonna Alanis, NP   Names of all persons participating in the telemedicine service and their role in the encounter:  Patient, Heriberto Antigua, [place], Windell Moulding NP.    Time spent on call:  8 minutes spent on the phone with Medical Assistant.

## 2023-04-16 NOTE — Progress Notes (Signed)
Careteam: Patient Care Team: Octavia Heir, NP as PCP - General (Adult Health Nurse Practitioner)  Seen by: Hazle Nordmann, AGNP-C  PLACE OF SERVICE:  [facility]  Advanced Directive information Does Patient Have a Medical Advance Directive?: No, Would patient like information on creating a medical advance directive?: No - Patient declined  No Known Allergies  Chief Complaint  Patient presents with   Acute Visit    Patient complains of having trouble sleeping.      HPI: Patient is a 47 y.o. female seen today via virtual visit due to insomnia.   H/o insomnia with Trazodone use in past. She is currently taking melatonin 6 mg at bedtime prn. She does not have trouble falling asleep. She will wake up in the middle of the night and unable to fall back asleep. She reports " my brain will not turn off." She is exercising about 3x/week. She does not use electronic devices or watch television prior to bedtime. She likes to read. She is also limiting fluids in the evening. She will sometimes have to urinate once at night. 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   Review of Systems:  Review of Systems  Respiratory:  Negative for cough, shortness of breath and wheezing.   Cardiovascular:  Negative for chest pain and leg swelling.  Psychiatric/Behavioral:  Negative for depression. The patient has insomnia. The patient is not nervous/anxious.     Past Medical History:  Diagnosis Date   ADD (attention deficit disorder)    Adult   Hypocalcemia 08/14/2022   Vitamin D deficiency 08/14/2022   Past Surgical History:  Procedure Laterality Date   ANTERIOR CERVICAL DECOMP/DISCECTOMY FUSION     Last year   Maxillofacial surgery      20 years ago   Social History:   reports that she has quit smoking. She has quit using smokeless tobacco. She reports current alcohol use. She reports that she does not use drugs.  Family History  Adopted: Yes  Problem Relation Age of  Onset   Osteoporosis Mother    Diabetes Maternal Grandmother    Dementia Maternal Grandfather    Heart disease Maternal Grandfather    Colon cancer Neg Hx    Colon polyps Neg Hx    Esophageal cancer Neg Hx    Stomach cancer Neg Hx    Rectal cancer Neg Hx     Medications: Patient's Medications  New Prescriptions   No medications on file  Previous Medications   MULTIPLE VITAMIN (MULTIVITAMIN PO)    Take 1 tablet by mouth daily.   OMEPRAZOLE (PRILOSEC) 20 MG CAPSULE    TAKE 1 CAPSULE(20 MG) BY MOUTH DAILY  Modified Medications   No medications on file  Discontinued Medications   SUCRALFATE (CARAFATE) 1 G TABLET    TAKE 1 TABLET(1 GRAM) BY MOUTH FOUR TIMES DAILY AT BEDTIME WITH MEALS    Physical Exam:  There were no vitals filed for this visit. There is no height or weight on file to calculate BMI. Wt Readings from Last 3 Encounters:  10/29/22 182 lb (82.6 kg)  09/25/22 170 lb (77.1 kg)  09/04/22 170 lb (77.1 kg)    Physical Exam Vitals (exam limited due to virtual visit) reviewed.  Constitutional:      General: She is not in acute distress. Neurological:     Mental Status: She is alert.     Labs reviewed: Basic Metabolic Panel: No results for input(s): "NA", "K", "CL", "  CO2", "GLUCOSE", "BUN", "CREATININE", "CALCIUM", "MG", "PHOS", "TSH" in the last 8760 hours. Liver Function Tests: No results for input(s): "AST", "ALT", "ALKPHOS", "BILITOT", "PROT", "ALBUMIN" in the last 8760 hours. No results for input(s): "LIPASE", "AMYLASE" in the last 8760 hours. No results for input(s): "AMMONIA" in the last 8760 hours. CBC: No results for input(s): "WBC", "NEUTROABS", "HGB", "HCT", "MCV", "PLT" in the last 8760 hours. Lipid Panel: No results for input(s): "CHOL", "HDL", "LDLCALC", "TRIG", "CHOLHDL", "LDLDIRECT" in the last 8760 hours. TSH: No results for input(s): "TSH" in the last 8760 hours. A1C: No results found for: "HGBA1C"   Assessment/Plan 1. Insomnia,  unspecified type - ongoing - waking up in middle of night> unable to fall back asleep - cont exercise - cont melatonin prn - cont reading prior to bed - recommend mind exercises - recommend aroma therapy  - will start trazodone prn - traZODone (DESYREL) 50 MG tablet; Take 0.5-1 tablets (25-50 mg total) by mouth at bedtime as needed for sleep.  Dispense: 30 tablet; Refill: 1  Virtual Visit   I connected with Donna Bowen and verified that I am speaking with the correct person using two identifiers.  Location: [facility] Patient: Donna Bowen Provider: Octavia Heir, NP    I discussed the limitations, risks, security and privacy concerns of performing an evaluation and management service by telephone and the availability of in person appointments. I also discussed with the patient that there may be a patient responsible charge related to this service. The patient expressed understanding and agreed to proceed.   I discussed the assessment and treatment plan with the patient. The patient was provided an opportunity to ask questions and all were answered. The patient agreed with the plan and demonstrated an understanding of the instructions.   The patient was advised to call back or seek an in-person evaluation if the symptoms worsen or if the condition fails to improve as anticipated.  I provided 11 minutes of face-to-face time during this encounter.  Toniesha Zellner Coletta Memos, AGNP Avs printed and mailed   Next appt: Visit date not found  Kriss Ishler Scherry Ran  [facility] [phone]

## 2023-04-16 NOTE — Patient Instructions (Signed)
Continue reading, limiting electronic device use at night  Will try Trazodone prn for bad nights  See if exercise helps sleep  Take multivitamin with dinner  Consider aroma therapy or showering in evening

## 2023-04-27 ENCOUNTER — Other Ambulatory Visit: Payer: Self-pay | Admitting: Gastroenterology

## 2023-05-12 ENCOUNTER — Other Ambulatory Visit: Payer: Self-pay | Admitting: Orthopedic Surgery

## 2023-05-12 DIAGNOSIS — Z0000 Encounter for general adult medical examination without abnormal findings: Secondary | ICD-10-CM

## 2023-05-13 ENCOUNTER — Other Ambulatory Visit: Payer: Self-pay

## 2023-05-13 DIAGNOSIS — Z6829 Body mass index (BMI) 29.0-29.9, adult: Secondary | ICD-10-CM

## 2023-05-14 ENCOUNTER — Encounter: Payer: No Typology Code available for payment source | Admitting: Orthopedic Surgery

## 2023-05-14 ENCOUNTER — Ambulatory Visit: Payer: No Typology Code available for payment source | Admitting: Orthopedic Surgery

## 2023-05-14 ENCOUNTER — Other Ambulatory Visit: Payer: No Typology Code available for payment source

## 2023-05-15 LAB — BASIC METABOLIC PANEL WITH GFR
BUN: 12 mg/dL (ref 7–25)
CO2: 24 mmol/L (ref 20–32)
Calcium: 9.9 mg/dL (ref 8.6–10.2)
Chloride: 103 mmol/L (ref 98–110)
Creat: 0.75 mg/dL (ref 0.50–0.99)
Glucose, Bld: 120 mg/dL — ABNORMAL HIGH (ref 65–99)
Potassium: 4.3 mmol/L (ref 3.5–5.3)
Sodium: 137 mmol/L (ref 135–146)
eGFR: 98 mL/min/{1.73_m2} (ref >=60)

## 2023-05-15 LAB — LIPID PANEL
Cholesterol: 187 mg/dL (ref <200)
HDL: 68 mg/dL (ref >=50)
LDL Cholesterol (Calc): 100 mg/dL — ABNORMAL HIGH
Non-HDL Cholesterol (Calc): 119 mg/dL (ref <130)
Total CHOL/HDL Ratio: 2.8 (calc) (ref <5.0)
Triglycerides: 96 mg/dL (ref <150)

## 2023-06-12 ENCOUNTER — Other Ambulatory Visit: Payer: Self-pay | Admitting: Orthopedic Surgery

## 2023-06-12 DIAGNOSIS — G4700 Insomnia, unspecified: Secondary | ICD-10-CM

## 2023-06-12 NOTE — Telephone Encounter (Signed)
Patient is requesting a refill of the following medications: Requested Prescriptions   Pending Prescriptions Disp Refills   traZODone (DESYREL) 50 MG tablet [Pharmacy Med Name: TRAZODONE 50MG  TABLETS] 30 tablet 1    Sig: TAKE 1/2 TO 1 TABLET(25 TO 50 MG) BY MOUTH AT BEDTIME AS NEEDED FOR SLEEP    Date of last refill:04/16/2023  Refill amount: 30 tablets 1 refill  Treatment agreement date: N/A

## 2023-06-22 LAB — HM PAP SMEAR: HPV, high-risk: NEGATIVE

## 2023-07-08 ENCOUNTER — Ambulatory Visit: Payer: No Typology Code available for payment source | Admitting: Family Medicine

## 2023-07-08 ENCOUNTER — Other Ambulatory Visit (HOSPITAL_COMMUNITY): Payer: Self-pay

## 2023-07-08 VITALS — BP 110/68 | HR 65 | Temp 98.0°F | Ht 65.0 in | Wt 185.9 lb

## 2023-07-08 DIAGNOSIS — F902 Attention-deficit hyperactivity disorder, combined type: Secondary | ICD-10-CM

## 2023-07-08 DIAGNOSIS — R739 Hyperglycemia, unspecified: Secondary | ICD-10-CM | POA: Diagnosis not present

## 2023-07-08 LAB — HEMOGLOBIN A1C: Hgb A1c MFr Bld: 5.9 % (ref 4.6–6.5)

## 2023-07-08 LAB — TSH: TSH: 3.21 u[IU]/mL (ref 0.35–5.50)

## 2023-07-08 MED ORDER — AMPHETAMINE-DEXTROAMPHET ER 20 MG PO CP24
20.0000 mg | ORAL_CAPSULE | ORAL | 0 refills | Status: DC
Start: 2023-08-05 — End: 2023-10-08
  Filled 2023-09-03: qty 30, 30d supply, fill #0

## 2023-07-08 MED ORDER — AMPHETAMINE-DEXTROAMPHET ER 20 MG PO CP24
20.0000 mg | ORAL_CAPSULE | ORAL | 0 refills | Status: AC
Start: 2023-09-04 — End: ?
  Filled 2023-10-06: qty 30, 30d supply, fill #0

## 2023-07-08 MED ORDER — AMPHETAMINE-DEXTROAMPHET ER 20 MG PO CP24
20.0000 mg | ORAL_CAPSULE | ORAL | 0 refills | Status: DC
Start: 2023-07-08 — End: 2023-08-04
  Filled 2023-07-08: qty 30, 30d supply, fill #0

## 2023-07-08 NOTE — Progress Notes (Signed)
New Patient Office Visit  Subjective    Patient ID: Donna Bowen, female    DOB: [birth year]  Age: 47 y.o. MRN: [id number]  CC:  Chief Complaint  Patient presents with   Establish Care    HPI Donna Bowen presents to establish care Patient states that she was diagnosed with ADHD, states that she used to be on medication for this in the past, but states she thought it was raising her blood pressure so she stopped taking the medication. States that she was handling it fairly well, however she started a new job where she needs better executive pt reports that she was on adderall and before that she was on concerta which caused sleep disturbances. Patient states that she did take wellbutrin to try and she didn't think it really helped.  Patient also has a history of GERD, was seeing Dr. Russella Dar who was prescribing this medication for her.   Also has chronic insomnia, states that she gets nighttime awakenings and cannot fall back asleep, states that the trazodone works relatively well for her.  Current Outpatient Medications  Medication Instructions   [START ON 09/04/2023] amphetamine-dextroamphetamine (ADDERALL XR) 20 MG 24 hr capsule 20 mg, Oral, BH-each morning   [START ON 08/05/2023] amphetamine-dextroamphetamine (ADDERALL XR) 20 MG 24 hr capsule 20 mg, Oral, BH-each morning   amphetamine-dextroamphetamine (ADDERALL XR) 20 MG 24 hr capsule 20 mg, Oral, BH-each morning   CALCIUM PO Oral, Daily   Multiple Vitamin (MULTIVITAMIN PO) 1 tablet, Oral, Daily   omeprazole (PRILOSEC) 20 MG capsule TAKE 1 CAPSULE(20 MG) BY MOUTH DAILY   traZODone (DESYREL) 50 MG tablet TAKE 1/2 TO 1 TABLET(25 TO 50 MG) BY MOUTH AT BEDTIME AS NEEDED FOR SLEEP    Past Medical History:  Diagnosis Date   ADD (attention deficit disorder)    Adult   Benign colonic polyp    Hypocalcemia 08/14/2022   Vitamin D deficiency 08/14/2022    Past Surgical History:  Procedure Laterality Date   ANTERIOR  CERVICAL DECOMP/DISCECTOMY FUSION     Last year   FACIAL COSMETIC SURGERY     Maxillofacial surgery      20 years ago    Family History  Adopted: Yes  Problem Relation Age of Onset   Osteoporosis Mother    Diabetes Maternal Grandmother    Dementia Maternal Grandfather    Heart disease Maternal Grandfather    Colon cancer Neg Hx    Colon polyps Neg Hx    Esophageal cancer Neg Hx    Stomach cancer Neg Hx    Rectal cancer Neg Hx     Social History   Socioeconomic History   Marital status: Divorced    Spouse name: Not on file   Number of children: Not on file   Years of education: Not on file   Highest education level: Master's degree (e.g., MA, MS, MEng, MEd, MSW, MBA)  Occupational History   Not on file  Tobacco Use   Smoking status: Former   Smokeless tobacco: Never  Vaping Use   Vaping status: Former  Substance and Sexual Activity   Alcohol use: Yes    Comment: occ   Drug use: Never   Sexual activity: Yes  Other Topics Concern   Not on file  Social History Narrative   Not on file   Social Determinants of Health   Financial Resource Strain: Low Risk  (07/08/2023)   Overall Financial Resource Strain (CARDIA)    Difficulty of Paying Living  Expenses: Not hard at all  Food Insecurity: No Food Insecurity (07/08/2023)   Hunger Vital Sign    Worried About Running Out of Food in the Last Year: Never true    Ran Out of Food in the Last Year: Never true  Transportation Needs: No Transportation Needs (07/08/2023)   PRAPARE - Administrator, Civil Service (Medical): No    Lack of Transportation (Non-Medical): No  Physical Activity: Insufficiently Active (07/08/2023)   Exercise Vital Sign    Days of Exercise per Week: 3 days    Minutes of Exercise per Session: 30 min  Stress: No Stress Concern Present (07/08/2023)   [employer] of Occupational Health - Occupational Stress Questionnaire    Feeling of Stress : Not at all  Social Connections: Socially  Isolated (07/08/2023)   Social Connection and Isolation Panel [NHANES]    Frequency of Communication with Friends and Family: Once a week    Frequency of Social Gatherings with Friends and Family: Once a week    Attends Religious Services: Never    Database administrator or Organizations: No    Attends Engineer, structural: Not on file    Marital Status: Separated  Intimate Partner Violence: Not on file    Review of Systems  All other systems reviewed and are negative.       Objective    BP 110/68 (BP Location: Left Arm, Patient Position: Sitting, Cuff Size: Large)   Pulse 65   Temp 98 F (36.7 C) (Oral)   Ht 5\' 5"  (1.651 m)   Wt 185 lb 14.4 oz (84.3 kg)   LMP 06/20/2023 (Exact Date)   SpO2 100%   BMI 30.94 kg/m   Physical Exam Vitals reviewed.  Constitutional:      Appearance: Normal appearance. She is well-groomed and normal weight.  Eyes:     Conjunctiva/sclera: Conjunctivae normal.  Neck:     Thyroid: No thyromegaly.  Cardiovascular:     Rate and Rhythm: Normal rate and regular rhythm.     Pulses: Normal pulses.     Heart sounds: S1 normal and S2 normal.  Pulmonary:     Effort: Pulmonary effort is normal.     Breath sounds: Normal breath sounds and air entry.  Abdominal:     General: Bowel sounds are normal.  Musculoskeletal:     Right lower leg: No edema.     Left lower leg: No edema.  Neurological:     Mental Status: She is alert and oriented to person, place, and time. Mental status is at baseline.     Gait: Gait is intact.  Psychiatric:        Mood and Affect: Mood and affect normal.        Speech: Speech normal.        Behavior: Behavior normal.        Judgment: Judgment normal.         Assessment & Plan:  Attention deficit hyperactivity disorder (ADHD), combined type Assessment & Plan: History was reviewed including old progress notes documenting her diagnosis. I advised that we restart the adderall at 20 mg daily. Risks/benefits  reviewed with patient. Will see her every 3 months for medication refills.   Orders: -     Amphetamine-Dextroamphet ER; Take 1 capsule (20 mg total) by mouth every morning.  Dispense: 30 capsule; Refill: 0 -     Amphetamine-Dextroamphet ER; Take 1 capsule (20 mg total) by mouth every morning.  Dispense: 30  capsule; Refill: 0 -     Amphetamine-Dextroamphet ER; Take 1 capsule (20 mg total) by mouth every morning.  Dispense: 30 capsule; Refill: 0 -     TSH  Hyperglycemia -     Hemoglobin A1c -     TSH  Pt did say that she was fasting when the last testing was done. I advised that we check an A1C and TSH to rule out endocrine causes of the hyperglycemia  Return in about 3 months (around 10/08/2023) for video visit for ADHD med refills.Karie Georges, MD

## 2023-07-08 NOTE — Progress Notes (Signed)
Noted  

## 2023-07-08 NOTE — Assessment & Plan Note (Signed)
History was reviewed including old progress notes documenting her diagnosis. I advised that we restart the adderall at 20 mg daily. Risks/benefits reviewed with patient. Will see her every 3 months for medication refills.

## 2023-07-15 ENCOUNTER — Other Ambulatory Visit (HOSPITAL_COMMUNITY): Payer: Self-pay

## 2023-07-15 ENCOUNTER — Encounter: Payer: Self-pay | Admitting: Family Medicine

## 2023-07-15 MED ORDER — TRAZODONE HCL 50 MG PO TABS
50.0000 mg | ORAL_TABLET | Freq: Every evening | ORAL | 0 refills | Status: DC | PRN
  Filled 2023-07-15 – 2023-09-22 (×2): qty 30, 30d supply, fill #0

## 2023-07-15 NOTE — Telephone Encounter (Signed)
[facility] to refill trazodone

## 2023-07-22 ENCOUNTER — Encounter (HOSPITAL_COMMUNITY): Payer: Self-pay

## 2023-07-22 ENCOUNTER — Other Ambulatory Visit (HOSPITAL_COMMUNITY): Payer: Self-pay

## 2023-08-04 ENCOUNTER — Other Ambulatory Visit: Payer: Self-pay | Admitting: Family Medicine

## 2023-08-04 DIAGNOSIS — F902 Attention-deficit hyperactivity disorder, combined type: Secondary | ICD-10-CM

## 2023-08-05 ENCOUNTER — Other Ambulatory Visit (HOSPITAL_COMMUNITY): Payer: Self-pay

## 2023-08-05 MED ORDER — AMPHETAMINE-DEXTROAMPHET ER 20 MG PO CP24
20.0000 mg | ORAL_CAPSULE | ORAL | 0 refills | Status: DC
  Filled 2023-08-05: qty 30, 30d supply, fill #0

## 2023-08-19 ENCOUNTER — Ambulatory Visit: Payer: No Typology Code available for payment source

## 2023-08-24 ENCOUNTER — Other Ambulatory Visit: Payer: Self-pay | Admitting: Adult Health

## 2023-08-24 ENCOUNTER — Other Ambulatory Visit: Payer: Self-pay | Admitting: Orthopedic Surgery

## 2023-08-24 DIAGNOSIS — G4700 Insomnia, unspecified: Secondary | ICD-10-CM

## 2023-09-03 ENCOUNTER — Other Ambulatory Visit: Payer: Self-pay

## 2023-09-03 ENCOUNTER — Encounter: Payer: Self-pay | Admitting: Orthopedic Surgery

## 2023-09-03 ENCOUNTER — Other Ambulatory Visit (HOSPITAL_COMMUNITY): Payer: Self-pay

## 2023-09-22 ENCOUNTER — Other Ambulatory Visit (HOSPITAL_COMMUNITY): Payer: Self-pay

## 2023-10-06 ENCOUNTER — Other Ambulatory Visit (HOSPITAL_COMMUNITY): Payer: Self-pay

## 2023-10-08 ENCOUNTER — Other Ambulatory Visit (HOSPITAL_COMMUNITY): Payer: Self-pay

## 2023-10-08 ENCOUNTER — Telehealth: Payer: No Typology Code available for payment source | Admitting: Family Medicine

## 2023-10-08 DIAGNOSIS — F902 Attention-deficit hyperactivity disorder, combined type: Secondary | ICD-10-CM

## 2023-10-08 MED ORDER — AMPHETAMINE-DEXTROAMPHET ER 20 MG PO CP24
20.0000 mg | ORAL_CAPSULE | ORAL | 0 refills | Status: DC
  Filled 2024-01-02: qty 30, 30d supply, fill #0

## 2023-10-08 MED ORDER — AMPHETAMINE-DEXTROAMPHETAMINE 10 MG PO TABS
5.0000 mg | ORAL_TABLET | Freq: Every day | ORAL | 0 refills | Status: DC
  Filled 2023-10-08: qty 30, 30d supply, fill #0

## 2023-10-08 MED ORDER — AMPHETAMINE-DEXTROAMPHET ER 20 MG PO CP24
20.0000 mg | ORAL_CAPSULE | ORAL | 0 refills | Status: DC
  Filled 2023-10-08 – 2023-11-04 (×3): qty 30, 30d supply, fill #0

## 2023-10-08 MED ORDER — AMPHETAMINE-DEXTROAMPHET ER 20 MG PO CP24
20.0000 mg | ORAL_CAPSULE | ORAL | 0 refills | Status: DC
  Filled 2023-12-03: qty 30, 30d supply, fill #0

## 2023-10-08 NOTE — Progress Notes (Signed)
   Virtual Medical Office Visit  Patient:  Donna Bowen      Age: 48 y.o.       Sex:  female  Date:   10/08/2023  PCP:    Karie Georges, MD   Today's Healthcare Provider: Karie Georges, MD    Assessment/Plan:   Summary assessment:  Attention deficit hyperactivity disorder (ADHD), combined type -     Amphetamine-Dextroamphet ER; Take 1 capsule (20 mg total) by mouth every morning.  Dispense: 30 capsule; Refill: 0 -     Amphetamine-Dextroamphet ER; Take 1 capsule (20 mg total) by mouth every morning. (11/04/23)  Dispense: 30 capsule; Refill: 0 -     Amphetamine-Dextroamphet ER; Take 1 capsule (20 mg total) by mouth every morning. (11/30/23)  Dispense: 30 capsule; Refill: 0 -     Amphetamine-Dextroamphetamine; Take 0.5-1 tablets (5-10 mg total) by mouth daily after lunch.  Dispense: 30 tablet; Refill: 0   Good initial response to medication, mornings are going well, however afternoons are still a problem, will add short acting 10 mg tablet as needed in the PM. RTC in 3 months  Return in about 3 months (around 01/06/2024) for ADHD med refills- video visit.   She was advised to call the office or go to ER if her condition worsens    Subjective:   Donna Bowen is a 48 y.o. female with PMH significant for: Past Medical History:  Diagnosis Date   ADD (attention deficit disorder)    Adult   Benign colonic polyp    Hypocalcemia 08/14/2022   Vitamin D deficiency 08/14/2022     Presenting today with: No chief complaint on file.    She clarifies and reports that her condition: ADHD-- pt reports that the adderall is getting a little "zoned out" in the afternoons, states that in the beginning it was working just fine, but now that she is used to it she is getting more unfocused and distracted. States she takes her medication around 7:30 and starts to feel this way around 2:30-3 pm. We discussed different strategies to handle this including adding an addition short acting  tablet in the afternoon and she is agreeable to this plan.   She denies having any: Sleep disturbances, maybe some dry mouth but this has also improved.  No heart racing or nervousness.          Objective/Observations  Physical Exam:  Polite and friendly Gen: NAD, resting comfortably Pulm: Normal work of breathing Neuro: Grossly normal, moves all extremities Psych: Normal affect and thought content Problem specific physical exam findings:    No images are attached to the encounter or orders placed in the encounter.    Results: No results found for any visits on 10/08/23.   No results found for this or any previous visit (from the past 2160 hours).         Virtual Visit via Video   I connected with Donna Bowen on 10/08/23 at  8:00 AM EST by a video enabled telemedicine application and verified that I am speaking with the correct person using two identifiers. The limitations of evaluation and management by telemedicine and the availability of in person appointments were discussed. The patient expressed understanding and agreed to proceed.   Percentage of appointment time on video:  100% Patient location: Home Provider location: [facility] Persons participating in the virtual visit: Myself and Patient

## 2023-11-03 ENCOUNTER — Other Ambulatory Visit (HOSPITAL_COMMUNITY): Payer: Self-pay

## 2023-11-03 ENCOUNTER — Ambulatory Visit
Admission: RE | Admit: 2023-11-03 | Discharge: 2023-11-03 | Disposition: A | Discharge status: Home / Self Care | Payer: No Typology Code available for payment source | Source: Ambulatory Visit | Attending: Orthopedic Surgery | Admitting: Orthopedic Surgery

## 2023-11-03 DIAGNOSIS — Z0000 Encounter for general adult medical examination without abnormal findings: Secondary | ICD-10-CM

## 2023-11-04 ENCOUNTER — Other Ambulatory Visit (HOSPITAL_COMMUNITY): Payer: Self-pay

## 2023-11-05 ENCOUNTER — Encounter: Payer: Self-pay | Admitting: Orthopedic Surgery

## 2023-11-17 ENCOUNTER — Other Ambulatory Visit (HOSPITAL_COMMUNITY): Payer: Self-pay

## 2023-11-17 ENCOUNTER — Other Ambulatory Visit: Payer: Self-pay | Admitting: Family Medicine

## 2023-11-17 DIAGNOSIS — F902 Attention-deficit hyperactivity disorder, combined type: Secondary | ICD-10-CM

## 2023-11-17 MED ORDER — AMPHETAMINE-DEXTROAMPHETAMINE 10 MG PO TABS
5.0000 mg | ORAL_TABLET | Freq: Every day | ORAL | 0 refills | Status: DC
  Filled 2023-11-17: qty 30, 30d supply, fill #0

## 2023-12-03 ENCOUNTER — Other Ambulatory Visit: Payer: Self-pay

## 2023-12-03 ENCOUNTER — Other Ambulatory Visit (HOSPITAL_COMMUNITY): Payer: Self-pay

## 2023-12-03 ENCOUNTER — Other Ambulatory Visit: Payer: Self-pay | Admitting: Family Medicine

## 2023-12-03 MED ORDER — TRAZODONE HCL 50 MG PO TABS
50.0000 mg | ORAL_TABLET | Freq: Every evening | ORAL | 1 refills | Status: AC | PRN
  Filled 2023-12-03: qty 90, 90d supply, fill #0
  Filled 2024-06-08: qty 90, 90d supply, fill #1

## 2023-12-29 ENCOUNTER — Other Ambulatory Visit (HOSPITAL_COMMUNITY): Payer: Self-pay

## 2023-12-29 ENCOUNTER — Other Ambulatory Visit: Payer: Self-pay | Admitting: Family Medicine

## 2023-12-29 ENCOUNTER — Other Ambulatory Visit: Payer: Self-pay

## 2023-12-29 DIAGNOSIS — F902 Attention-deficit hyperactivity disorder, combined type: Secondary | ICD-10-CM

## 2023-12-29 MED ORDER — AMPHETAMINE-DEXTROAMPHETAMINE 10 MG PO TABS
5.0000 mg | ORAL_TABLET | Freq: Every day | ORAL | 0 refills | Status: DC
  Filled 2023-12-29: qty 30, 30d supply, fill #0

## 2023-12-30 ENCOUNTER — Other Ambulatory Visit (HOSPITAL_COMMUNITY): Payer: Self-pay

## 2024-01-02 ENCOUNTER — Other Ambulatory Visit (HOSPITAL_BASED_OUTPATIENT_CLINIC_OR_DEPARTMENT_OTHER): Payer: Self-pay

## 2024-02-02 ENCOUNTER — Ambulatory Visit: Admitting: Family Medicine

## 2024-02-04 ENCOUNTER — Other Ambulatory Visit: Payer: Self-pay | Admitting: Family Medicine

## 2024-02-04 DIAGNOSIS — F902 Attention-deficit hyperactivity disorder, combined type: Secondary | ICD-10-CM

## 2024-02-05 ENCOUNTER — Other Ambulatory Visit: Payer: Self-pay | Admitting: Family Medicine

## 2024-02-05 ENCOUNTER — Other Ambulatory Visit (HOSPITAL_BASED_OUTPATIENT_CLINIC_OR_DEPARTMENT_OTHER): Payer: Self-pay

## 2024-02-05 DIAGNOSIS — F902 Attention-deficit hyperactivity disorder, combined type: Secondary | ICD-10-CM

## 2024-02-05 MED ORDER — AMPHETAMINE-DEXTROAMPHETAMINE 10 MG PO TABS
5.0000 mg | ORAL_TABLET | Freq: Every day | ORAL | 0 refills | Status: DC
  Filled 2024-02-05: qty 30, 30d supply, fill #0

## 2024-02-05 MED ORDER — AMPHETAMINE-DEXTROAMPHET ER 20 MG PO CP24
20.0000 mg | ORAL_CAPSULE | ORAL | 0 refills | Status: DC
  Filled 2024-02-05: qty 30, 30d supply, fill #0

## 2024-02-09 ENCOUNTER — Ambulatory Visit: Admitting: Family Medicine

## 2024-02-12 ENCOUNTER — Telehealth (INDEPENDENT_AMBULATORY_CARE_PROVIDER_SITE_OTHER): Admitting: Family Medicine

## 2024-02-12 ENCOUNTER — Encounter: Payer: Self-pay | Admitting: Family Medicine

## 2024-02-12 ENCOUNTER — Other Ambulatory Visit (HOSPITAL_BASED_OUTPATIENT_CLINIC_OR_DEPARTMENT_OTHER): Payer: Self-pay

## 2024-02-12 DIAGNOSIS — F902 Attention-deficit hyperactivity disorder, combined type: Secondary | ICD-10-CM | POA: Diagnosis not present

## 2024-02-12 MED ORDER — AMPHETAMINE-DEXTROAMPHET ER 20 MG PO CP24
20.0000 mg | ORAL_CAPSULE | ORAL | 0 refills | Status: DC
Start: 2024-03-04 — End: 2024-05-12
  Filled 2024-03-04: qty 30, 30d supply, fill #0

## 2024-02-12 MED ORDER — AMPHETAMINE-DEXTROAMPHETAMINE 10 MG PO TABS
5.0000 mg | ORAL_TABLET | Freq: Every day | ORAL | 0 refills | Status: DC
  Filled 2024-03-04: qty 30, 30d supply, fill #0

## 2024-02-12 MED ORDER — AMPHETAMINE-DEXTROAMPHET ER 20 MG PO CP24
20.0000 mg | ORAL_CAPSULE | ORAL | 0 refills | Status: DC
Start: 2024-04-01 — End: 2024-05-12
  Filled 2024-04-06: qty 30, 30d supply, fill #0

## 2024-02-12 MED ORDER — AMPHETAMINE-DEXTROAMPHET ER 20 MG PO CP24
20.0000 mg | ORAL_CAPSULE | ORAL | 0 refills | Status: DC
  Filled 2024-05-10: qty 30, 30d supply, fill #0

## 2024-02-12 NOTE — Progress Notes (Signed)
   Virtual Medical Office Visit  Patient:  Donna Bowen      Age: 48 y.o.       Sex:  female  Date:   02/15/2024  PCP:    Aida House, MD   Today's Healthcare Provider: Aida House, MD    Assessment/Plan:   Summary assessment:  Pernella was seen today for medical management of chronic issues.  Attention deficit hyperactivity disorder (ADHD), combined type -     Amphetamine -Dextroamphet ER; Take 1 capsule (20 mg total) by mouth every morning.  Dispense: 30 capsule; Refill: 0 -     Amphetamine -Dextroamphet ER; Take 1 capsule (20 mg total) by mouth every morning. (11/30/23)  Dispense: 30 capsule; Refill: 0 -     Amphetamine -Dextroamphetamine ; Take 1/2-1 tablet (5-10 mg total) by mouth daily after lunch.  Dispense: 30 tablet; Refill: 0 -     Amphetamine -Dextroamphet ER; Take 1 capsule (20 mg total) by mouth every morning.  Dispense: 30 capsule; Refill: 0   Doing well on the above dosing of adderall. Will continue as prescribed. Refills sent to pharmacy.   Return in about 5 months (around 07/11/2024) for annual physical exam - come fasting for labs.   She was advised to call the office or go to ER if her condition worsens    Subjective:   Donna Bowen is a 48 y.o. female with PMH significant for: Past Medical History:  Diagnosis Date   ADD (attention deficit disorder)    Adult   Benign colonic polyp    Hypocalcemia 08/14/2022   Vitamin D deficiency 08/14/2022     Presenting today with: Chief Complaint  Patient presents with   Medical Management of Chronic Issues     She clarifies and reports that her condition: ADHD-- pt is reporting that the 10 mg in the afternoon is working well, states that she is not "zoning out" anymore like she was previously. She would like to continue the current dose of her medication.   She denies having any: Side effects.  No difficulty sleeping, no irritability         Objective/Observations  Physical  Exam:  Polite and friendly Gen: NAD, resting comfortably Pulm: Normal work of breathing Neuro: Grossly normal, moves all extremities Psych: Normal affect and thought content Problem specific physical exam findings:    No images are attached to the encounter or orders placed in the encounter.    Results: No results found for any visits on 02/12/24.   No results found for this or any previous visit (from the past 2160 hours).         Virtual Visit via Video   I connected with Delvin File on 02/15/24 at  2:30 PM EDT by a video enabled telemedicine application and verified that I am speaking with the correct person using two identifiers. The limitations of evaluation and management by telemedicine and the availability of in person appointments were discussed. The patient expressed understanding and agreed to proceed.   Percentage of appointment time on video:  100% Patient location: Home Provider location: [facility] Persons participating in the virtual visit: Myself and Patient

## 2024-03-04 ENCOUNTER — Other Ambulatory Visit (HOSPITAL_BASED_OUTPATIENT_CLINIC_OR_DEPARTMENT_OTHER): Payer: Self-pay

## 2024-03-14 ENCOUNTER — Encounter: Payer: Self-pay | Admitting: Family Medicine

## 2024-04-06 ENCOUNTER — Other Ambulatory Visit (HOSPITAL_BASED_OUTPATIENT_CLINIC_OR_DEPARTMENT_OTHER): Payer: Self-pay

## 2024-04-06 ENCOUNTER — Other Ambulatory Visit: Payer: Self-pay | Admitting: Family Medicine

## 2024-04-06 ENCOUNTER — Other Ambulatory Visit: Payer: Self-pay

## 2024-04-06 DIAGNOSIS — F902 Attention-deficit hyperactivity disorder, combined type: Secondary | ICD-10-CM

## 2024-04-07 ENCOUNTER — Other Ambulatory Visit (HOSPITAL_BASED_OUTPATIENT_CLINIC_OR_DEPARTMENT_OTHER): Payer: Self-pay

## 2024-04-07 ENCOUNTER — Other Ambulatory Visit: Payer: Self-pay

## 2024-04-07 MED ORDER — AMPHETAMINE-DEXTROAMPHETAMINE 10 MG PO TABS
5.0000 mg | ORAL_TABLET | Freq: Every day | ORAL | 0 refills | Status: DC
  Filled 2024-04-07: qty 30, 30d supply, fill #0

## 2024-05-10 ENCOUNTER — Other Ambulatory Visit (HOSPITAL_BASED_OUTPATIENT_CLINIC_OR_DEPARTMENT_OTHER): Payer: Self-pay

## 2024-05-10 ENCOUNTER — Other Ambulatory Visit: Payer: Self-pay

## 2024-05-10 ENCOUNTER — Other Ambulatory Visit: Payer: Self-pay | Admitting: Family Medicine

## 2024-05-10 DIAGNOSIS — F902 Attention-deficit hyperactivity disorder, combined type: Secondary | ICD-10-CM

## 2024-05-10 MED ORDER — AMPHETAMINE-DEXTROAMPHETAMINE 10 MG PO TABS
5.0000 mg | ORAL_TABLET | Freq: Every day | ORAL | 0 refills | Status: DC
  Filled 2024-05-10: qty 30, 30d supply, fill #0

## 2024-05-12 ENCOUNTER — Encounter: Payer: Self-pay | Admitting: Family Medicine

## 2024-05-12 ENCOUNTER — Ambulatory Visit: Admitting: Family Medicine

## 2024-05-12 ENCOUNTER — Other Ambulatory Visit (HOSPITAL_BASED_OUTPATIENT_CLINIC_OR_DEPARTMENT_OTHER): Payer: Self-pay

## 2024-05-12 VITALS — BP 130/80 | HR 98 | Temp 97.9°F | Ht 65.0 in | Wt 160.3 lb

## 2024-05-12 DIAGNOSIS — R7303 Prediabetes: Secondary | ICD-10-CM

## 2024-05-12 DIAGNOSIS — F902 Attention-deficit hyperactivity disorder, combined type: Secondary | ICD-10-CM | POA: Diagnosis not present

## 2024-05-12 MED ORDER — AMPHETAMINE-DEXTROAMPHET ER 20 MG PO CP24
20.0000 mg | ORAL_CAPSULE | ORAL | 0 refills | Status: DC
  Filled 2024-05-12 – 2024-07-11 (×2): qty 30, 30d supply, fill #0

## 2024-05-12 MED ORDER — AMPHETAMINE-DEXTROAMPHET ER 20 MG PO CP24: 20.0000 mg | ORAL_CAPSULE | ORAL | 0 refills | Status: DC

## 2024-05-12 MED ORDER — AMPHETAMINE-DEXTROAMPHET ER 20 MG PO CP24
20.0000 mg | ORAL_CAPSULE | ORAL | 0 refills | Status: DC
  Filled 2024-06-08: qty 30, 30d supply, fill #0

## 2024-05-12 NOTE — Progress Notes (Signed)
 Established Patient Office Visit  Subjective   Patient ID: Donna Bowen, female    DOB: 1976-01-17  Age: 48 y.o. MRN: [id number]  Chief Complaint  Patient presents with   Medical Management of Chronic Issues    Pt is here for follow up today on ADHD, pt reports that her medication is working ok still sometimes finding herself switching tasks in the middle of work, states she isn't sure if it is something to be concerned about but wonders when it will be a concern. Pt states she gest interrupted. I counseled the patient on this and when it would become a concern. No side effects reported.   Pt reports that she drinks about 2-3 drinks of alcohol per day, states that this waxes and wanes over time but lately she has been more consistent in her drinking than is usual. Is concerned because her aunt was diagnosed with cirrhosis recently.     Current Outpatient Medications  Medication Instructions   amphetamine -dextroamphetamine  (ADDERALL XR) 20 MG 24 hr capsule 20 mg, Oral, BH-each morning   [START ON 06/08/2024] amphetamine -dextroamphetamine  (ADDERALL XR) 20 MG 24 hr capsule 20 mg, Oral, BH-each morning   [START ON 07/08/2024] amphetamine -dextroamphetamine  (ADDERALL XR) 20 MG 24 hr capsule 20 mg, Oral, BH-each morning   amphetamine -dextroamphetamine  (ADDERALL) 10 MG tablet Take 1/2-1 tablet (5-10 mg total) by mouth daily after lunch.   CALCIUM PO Daily   Multiple Vitamin (MULTIVITAMIN PO) 1 tablet, Daily   omeprazole  (PRILOSEC) 20 MG capsule TAKE 1 CAPSULE(20 MG) BY MOUTH DAILY   traZODone  (DESYREL ) 50 mg, Oral, At bedtime PRN    Patient Active Problem List   Diagnosis Date Noted   Prediabetes 05/16/2024   Vitamin D deficiency 08/14/2022   Hypocalcemia 08/14/2022   Insomnia 05/28/2017   ADD (attention deficit disorder) 02/04/2013   DUB (dysfunctional uterine bleeding) 02/04/2013      Review of Systems  All other systems reviewed and are negative.     Objective:      BP 130/80   Pulse 98   Temp 97.9 F (36.6 C) (Oral)   Ht 5' 5 (1.651 m)   Wt 160 lb 4.8 oz (72.7 kg)   LMP 04/19/2024 (Exact Date)   SpO2 98%   BMI 26.68 kg/m    Physical Exam Vitals reviewed.  Constitutional:      Appearance: Normal appearance. She is well-groomed and normal weight.  Cardiovascular:     Rate and Rhythm: Normal rate and regular rhythm.     Heart sounds: S1 normal and S2 normal.  Pulmonary:     Effort: Pulmonary effort is normal.     Breath sounds: Normal breath sounds and air entry.  Musculoskeletal:     Right lower leg: No edema.     Left lower leg: No edema.  Neurological:     Mental Status: She is alert and oriented to person, place, and time. Mental status is at baseline.     Gait: Gait is intact.  Psychiatric:        Mood and Affect: Mood and affect normal.        Speech: Speech normal.        Behavior: Behavior normal.      No results found for any visits on 05/12/24.    The 10-year ASCVD risk score (Arnett DK, et al., 2019) is: 0.9%    Assessment & Plan:  Prediabetes Assessment & Plan: Counseled patient on alcohol and the risks of drinking too much. Pt is  currently drinking 2-3 glasses per day which is above what is considered safe in females, in fact some studies show that any consumption of alcohol can have impacts on organ function. Will order US  liver to rule out hepatic steatosis.   Orders: -     US  ABDOMEN LIMITED RUQ (LIVER/GB); Future  Attention deficit hyperactivity disorder (ADHD), combined type Assessment & Plan: Pt is doing well on the 20 mg capsules daily, will continue as prescribed. Refills sent to pharmacy  Orders: -     Amphetamine -Dextroamphet ER; Take 1 capsule (20 mg total) by mouth every morning.  Dispense: 30 capsule; Refill: 0 -     Amphetamine -Dextroamphet ER; Take 1 capsule (20 mg total) by mouth every morning.  Dispense: 30 capsule; Refill: 0 -     Amphetamine -Dextroamphet ER; Take 1 capsule (20 mg  total) by mouth every morning.  Dispense: 30 capsule; Refill: 0   30 minutes spent with patient today counseling in alcohol consumption and reviewing her last set of labs.   Return in about 3 months (around 08/11/2024) for annual physical exam.    Heron CHRISTELLA Sharper, MD

## 2024-05-16 DIAGNOSIS — R7303 Prediabetes: Secondary | ICD-10-CM | POA: Insufficient documentation

## 2024-05-16 NOTE — Assessment & Plan Note (Signed)
 Counseled patient on alcohol and the risks of drinking too much. Pt is currently drinking 2-3 glasses per day which is above what is considered safe in females, in fact some studies show that any consumption of alcohol can have impacts on organ function. Will order US  liver to rule out hepatic steatosis.

## 2024-05-16 NOTE — Assessment & Plan Note (Signed)
 Pt is doing well on the 20 mg capsules daily, will continue as prescribed. Refills sent to pharmacy

## 2024-05-26 ENCOUNTER — Ambulatory Visit
Admission: RE | Admit: 2024-05-26 | Discharge: 2024-05-26 | Disposition: A | Discharge status: Home / Self Care | Source: Ambulatory Visit | Attending: Family Medicine | Admitting: Family Medicine

## 2024-05-26 DIAGNOSIS — R7303 Prediabetes: Secondary | ICD-10-CM

## 2024-05-29 ENCOUNTER — Ambulatory Visit: Payer: Self-pay | Admitting: Family Medicine

## 2024-06-08 ENCOUNTER — Other Ambulatory Visit: Payer: Self-pay | Admitting: Family Medicine

## 2024-06-08 DIAGNOSIS — F902 Attention-deficit hyperactivity disorder, combined type: Secondary | ICD-10-CM

## 2024-06-09 ENCOUNTER — Other Ambulatory Visit (HOSPITAL_BASED_OUTPATIENT_CLINIC_OR_DEPARTMENT_OTHER): Payer: Self-pay

## 2024-06-09 ENCOUNTER — Other Ambulatory Visit: Payer: Self-pay

## 2024-06-10 ENCOUNTER — Other Ambulatory Visit (HOSPITAL_BASED_OUTPATIENT_CLINIC_OR_DEPARTMENT_OTHER): Payer: Self-pay

## 2024-06-10 MED ORDER — AMPHETAMINE-DEXTROAMPHETAMINE 10 MG PO TABS
5.0000 mg | ORAL_TABLET | Freq: Every day | ORAL | 0 refills | Status: DC
  Filled 2024-06-10: qty 30, 30d supply, fill #0

## 2024-07-11 ENCOUNTER — Other Ambulatory Visit (HOSPITAL_BASED_OUTPATIENT_CLINIC_OR_DEPARTMENT_OTHER): Payer: Self-pay

## 2024-07-11 ENCOUNTER — Other Ambulatory Visit: Payer: Self-pay | Admitting: Family Medicine

## 2024-07-11 DIAGNOSIS — F902 Attention-deficit hyperactivity disorder, combined type: Secondary | ICD-10-CM

## 2024-07-12 ENCOUNTER — Other Ambulatory Visit (HOSPITAL_BASED_OUTPATIENT_CLINIC_OR_DEPARTMENT_OTHER): Payer: Self-pay

## 2024-07-12 ENCOUNTER — Other Ambulatory Visit: Payer: Self-pay

## 2024-07-12 MED ORDER — AMPHETAMINE-DEXTROAMPHETAMINE 10 MG PO TABS
5.0000 mg | ORAL_TABLET | Freq: Every day | ORAL | 0 refills | Status: DC
  Filled 2024-07-12: qty 30, 30d supply, fill #0

## 2024-08-09 ENCOUNTER — Ambulatory Visit (INDEPENDENT_AMBULATORY_CARE_PROVIDER_SITE_OTHER): Admitting: Family Medicine

## 2024-08-09 ENCOUNTER — Other Ambulatory Visit: Payer: Self-pay

## 2024-08-09 ENCOUNTER — Other Ambulatory Visit (HOSPITAL_BASED_OUTPATIENT_CLINIC_OR_DEPARTMENT_OTHER): Payer: Self-pay

## 2024-08-09 ENCOUNTER — Encounter: Payer: Self-pay | Admitting: Family Medicine

## 2024-08-09 VITALS — BP 122/80 | HR 95 | Temp 98.0°F | Ht 66.0 in | Wt 158.7 lb

## 2024-08-09 DIAGNOSIS — Z13220 Encounter for screening for lipoid disorders: Secondary | ICD-10-CM | POA: Diagnosis not present

## 2024-08-09 DIAGNOSIS — R7303 Prediabetes: Secondary | ICD-10-CM | POA: Diagnosis not present

## 2024-08-09 DIAGNOSIS — Z0000 Encounter for general adult medical examination without abnormal findings: Secondary | ICD-10-CM | POA: Diagnosis not present

## 2024-08-09 DIAGNOSIS — F902 Attention-deficit hyperactivity disorder, combined type: Secondary | ICD-10-CM | POA: Diagnosis not present

## 2024-08-09 DIAGNOSIS — E559 Vitamin D deficiency, unspecified: Secondary | ICD-10-CM | POA: Diagnosis not present

## 2024-08-09 LAB — COMPREHENSIVE METABOLIC PANEL WITH GFR
ALT: 20 U/L (ref 0–35)
AST: 22 U/L (ref 0–37)
Albumin: 4.5 g/dL (ref 3.5–5.2)
Alkaline Phosphatase: 75 U/L (ref 39–117)
BUN: 11 mg/dL (ref 6–23)
CO2: 31 meq/L (ref 19–32)
Calcium: 10 mg/dL (ref 8.4–10.5)
Chloride: 100 meq/L (ref 96–112)
Creatinine, Ser: 0.74 mg/dL (ref 0.40–1.20)
GFR: 94.34 mL/min (ref >60.00)
Glucose, Bld: 96 mg/dL (ref 70–99)
Potassium: 4.9 meq/L (ref 3.5–5.1)
Sodium: 139 meq/L (ref 135–145)
Total Bilirubin: 0.5 mg/dL (ref 0.2–1.2)
Total Protein: 7.2 g/dL (ref 6.0–8.3)

## 2024-08-09 LAB — CBC WITH DIFFERENTIAL/PLATELET
Basophils Absolute: 0.1 K/uL (ref 0.0–0.1)
Basophils Relative: 1.1 % (ref 0.0–3.0)
Eosinophils Absolute: 0.2 K/uL (ref 0.0–0.7)
Eosinophils Relative: 3.4 % (ref 0.0–5.0)
HCT: 40.8 % (ref 36.0–46.0)
Hemoglobin: 13.8 g/dL (ref 12.0–15.0)
Lymphocytes Relative: 36.5 % (ref 12.0–46.0)
Lymphs Abs: 2.3 K/uL (ref 0.7–4.0)
MCHC: 33.8 g/dL (ref 30.0–36.0)
MCV: 90.3 fl (ref 78.0–100.0)
Monocytes Absolute: 0.5 K/uL (ref 0.1–1.0)
Monocytes Relative: 7.7 % (ref 3.0–12.0)
Neutro Abs: 3.3 K/uL (ref 1.4–7.7)
Neutrophils Relative %: 51.3 % (ref 43.0–77.0)
Platelets: 370 K/uL (ref 150.0–400.0)
RBC: 4.52 Mil/uL (ref 3.87–5.11)
RDW: 13.6 % (ref 11.5–15.5)
WBC: 6.4 K/uL (ref 4.0–10.5)

## 2024-08-09 LAB — LIPID PANEL
Cholesterol: 227 mg/dL — ABNORMAL HIGH (ref 0–200)
HDL: 92.7 mg/dL (ref >39.00)
LDL Cholesterol: 106 mg/dL — ABNORMAL HIGH (ref 0–99)
NonHDL: 133.86
Total CHOL/HDL Ratio: 2
Triglycerides: 139 mg/dL (ref 0.0–149.0)
VLDL: 27.8 mg/dL (ref 0.0–40.0)

## 2024-08-09 LAB — VITAMIN D 25 HYDROXY (VIT D DEFICIENCY, FRACTURES): VITD: 47.6 ng/mL (ref 30.00–100.00)

## 2024-08-09 LAB — HEMOGLOBIN A1C: Hgb A1c MFr Bld: 5.5 % (ref 4.6–6.5)

## 2024-08-09 MED ORDER — AMPHETAMINE-DEXTROAMPHETAMINE 10 MG PO TABS
5.0000 mg | ORAL_TABLET | Freq: Every day | ORAL | 0 refills | Status: AC
  Filled 2024-09-07: qty 30, 30d supply, fill #0

## 2024-08-09 MED ORDER — AMPHETAMINE-DEXTROAMPHETAMINE 10 MG PO TABS: 5.0000 mg | ORAL_TABLET | Freq: Every day | ORAL | 0 refills | Status: AC

## 2024-08-09 MED ORDER — AMPHETAMINE-DEXTROAMPHETAMINE 10 MG PO TABS
5.0000 mg | ORAL_TABLET | Freq: Every day | ORAL | 0 refills | Status: AC
  Filled 2024-08-09: qty 30, 30d supply, fill #0

## 2024-08-09 MED ORDER — AMPHETAMINE-DEXTROAMPHET ER 30 MG PO CP24
30.0000 mg | ORAL_CAPSULE | Freq: Every day | ORAL | 0 refills | Status: AC
  Filled 2024-08-09: qty 30, 30d supply, fill #0

## 2024-08-09 MED ORDER — AMPHETAMINE-DEXTROAMPHET ER 30 MG PO CP24: 30.0000 mg | ORAL_CAPSULE | Freq: Every day | ORAL | 0 refills | Status: AC

## 2024-08-09 MED ORDER — AMPHETAMINE-DEXTROAMPHET ER 30 MG PO CP24
30.0000 mg | ORAL_CAPSULE | Freq: Every day | ORAL | 0 refills | Status: AC
  Filled 2024-09-07: qty 30, 30d supply, fill #0

## 2024-08-09 NOTE — Patient Instructions (Addendum)
 Shingles and tetanus vaccines are due.  Health Maintenance, Female Adopting a healthy lifestyle and getting preventive care are important in promoting health and wellness. Ask your health care provider about: The right schedule for you to have regular tests and exams. Things you can do on your own to prevent diseases and keep yourself healthy. What should I know about diet, weight, and exercise? Eat a healthy diet  Eat a diet that includes plenty of vegetables, fruits, low-fat dairy products, and lean protein. Do not eat a lot of foods that are high in solid fats, added sugars, or sodium. Maintain a healthy weight Body mass index (BMI) is used to identify weight problems. It estimates body fat based on height and weight. Your health care provider can help determine your BMI and help you achieve or maintain a healthy weight. Get regular exercise Get regular exercise. This is one of the most important things you can do for your health. Most adults should: Exercise for at least 150 minutes each week. The exercise should increase your heart rate and make you sweat (moderate-intensity exercise). Do strengthening exercises at least twice a week. This is in addition to the moderate-intensity exercise. Spend less time sitting. Even light physical activity can be beneficial. Watch cholesterol and blood lipids Have your blood tested for lipids and cholesterol at 50 years of age, then have this test every 5 years. Have your cholesterol levels checked more often if: Your lipid or cholesterol levels are high. You are older than 50 years of age. You are at high risk for heart disease. What should I know about cancer screening? Depending on your health history and family history, you may need to have cancer screening at various ages. This may include screening for: Breast cancer. Cervical cancer. Colorectal cancer. Skin cancer. Lung cancer. What should I know about heart disease, diabetes, and high  blood pressure? Blood pressure and heart disease High blood pressure causes heart disease and increases the risk of stroke. This is more likely to develop in people who have high blood pressure readings or are overweight. Have your blood pressure checked: Every 3-5 years if you are 55-30 years of age. Every year if you are 23 years old or older. Diabetes Have regular diabetes screenings. This checks your fasting blood sugar level. Have the screening done: Once every three years after age 56 if you are at a normal weight and have a low risk for diabetes. More often and at a younger age if you are overweight or have a high risk for diabetes. What should I know about preventing infection? Hepatitis B If you have a higher risk for hepatitis B, you should be screened for this virus. Talk with your health care provider to find out if you are at risk for hepatitis B infection. Hepatitis C Testing is recommended for: Everyone born from [birth year] through 1965. Anyone with known risk factors for hepatitis C. Sexually transmitted infections (STIs) Get screened for STIs, including gonorrhea and chlamydia, if: You are sexually active and are younger than 50 years of age. You are older than 50 years of age and your health care provider tells you that you are at risk for this type of infection. Your sexual activity has changed since you were last screened, and you are at increased risk for chlamydia or gonorrhea. Ask your health care provider if you are at risk. Ask your health care provider about whether you are at high risk for HIV. Your health care provider may recommend  a prescription medicine to help prevent HIV infection. If you choose to take medicine to prevent HIV, you should first get tested for HIV. You should then be tested every 3 months for as long as you are taking the medicine. Pregnancy If you are about to stop having your period (premenopausal) and you may become pregnant, seek counseling  before you get pregnant. Take 400 to 800 micrograms (mcg) of folic acid every day if you become pregnant. Ask for birth control (contraception) if you want to prevent pregnancy. Osteoporosis and menopause Osteoporosis is a disease in which the bones lose minerals and strength with aging. This can result in bone fractures. If you are 30 years old or older, or if you are at risk for osteoporosis and fractures, ask your health care provider if you should: Be screened for bone loss. Take a calcium or vitamin D supplement to lower your risk of fractures. Be given hormone replacement therapy (HRT) to treat symptoms of menopause. Follow these instructions at home: Alcohol use Do not drink alcohol if: Your health care provider tells you not to drink. You are pregnant, may be pregnant, or are planning to become pregnant. If you drink alcohol: Limit how much you have to: 0-1 drink a day. Know how much alcohol is in your drink. In the [place] one drink equals one 12 oz bottle of beer (355 mL), one 5 oz glass of wine (148 mL), or one 1 oz glass of hard liquor (44 mL). Lifestyle Do not use any products that contain nicotine or tobacco. These products include cigarettes, chewing tobacco, and vaping devices, such as e-cigarettes. If you need help quitting, ask your health care provider. Do not use street drugs. Do not share needles. Ask your health care provider for help if you need support or information about quitting drugs. General instructions Schedule regular health, dental, and eye exams. Stay current with your vaccines. Tell your health care provider if: You often feel depressed. You have ever been abused or do not feel safe at home. Summary Adopting a healthy lifestyle and getting preventive care are important in promoting health and wellness. Follow your health care provider's instructions about healthy diet, exercising, and getting tested or screened for diseases. Follow your health care  provider's instructions on monitoring your cholesterol and blood pressure. This information is not intended to replace advice given to you by your health care provider. Make sure you discuss any questions you have with your health care provider. Document Revised: 01/14/2021 Document Reviewed: 01/14/2021 Elsevier Patient Education  2024 [employer].

## 2024-08-09 NOTE — Progress Notes (Signed)
 Complete physical exam  Patient: Donna Bowen   DOB: 1976-04-02   48 y.o. Female  MRN: [id number]  Subjective:    Chief Complaint  Patient presents with   Annual Exam    Donna Bowen is a 48 y.o. female who presents today for a complete physical exam. She reports consuming a general diet. Eats fruits and veggies often, getting good sources of protein, eats dairy. Takes a MVI and calcium daily. The patient does not participate in regular exercise at present. She generally feels well. She reports sleeping fairly well. She does not have additional problems to discuss today.    Most recent fall risk assessment:    04/16/2023    1:37 PM  Fall Risk   Falls in the past year? 0  Number falls in past yr: 0  Injury with Fall? 0   Risk for fall due to : No Fall Risks  Follow up Falls evaluation completed;Education provided;Falls prevention discussed     Data saved with a previous flowsheet row definition     Most recent depression screenings:    08/09/2024    8:19 AM 02/12/2024    2:12 PM  PHQ 2/9 Scores  PHQ - 2 Score 0 0  PHQ- 9 Score 0 0      Data saved with a previous flowsheet row definition    Vision:Within last year and Dental: No current dental problems and Receives regular dental care  Patient Active Problem List   Diagnosis Date Noted   Prediabetes 05/16/2024   Vitamin D deficiency 08/14/2022   Hypocalcemia 08/14/2022   Insomnia 05/28/2017   ADD (attention deficit disorder) 02/04/2013   DUB (dysfunctional uterine bleeding) 02/04/2013      Patient Care Team: Ozell Heron HERO, MD as PCP - General (Family Medicine)   Outpatient Medications Prior to Visit  Medication Sig   CALCIUM PO Take by mouth daily.   Multiple Vitamin (MULTIVITAMIN PO) Take 1 tablet by mouth daily.   omeprazole  (PRILOSEC) 20 MG capsule TAKE 1 CAPSULE(20 MG) BY MOUTH DAILY   traZODone  (DESYREL ) 50 MG tablet Take 1 tablet (50 mg total) by mouth at bedtime as needed for sleep.    [DISCONTINUED] amphetamine -dextroamphetamine  (ADDERALL XR) 20 MG 24 hr capsule Take 1 capsule (20 mg total) by mouth every morning.   [DISCONTINUED] amphetamine -dextroamphetamine  (ADDERALL XR) 20 MG 24 hr capsule Take 1 capsule (20 mg total) by mouth every morning.   [DISCONTINUED] amphetamine -dextroamphetamine  (ADDERALL XR) 20 MG 24 hr capsule Take 1 capsule (20 mg total) by mouth every morning.   [DISCONTINUED] amphetamine -dextroamphetamine  (ADDERALL) 10 MG tablet Take 1/2-1 tablet (5-10 mg total) by mouth daily after lunch.   No facility-administered medications prior to visit.    Review of Systems  HENT:  Negative for hearing loss.   Eyes:  Negative for blurred vision.  Respiratory:  Negative for shortness of breath.   Cardiovascular:  Negative for chest pain.  Gastrointestinal: Negative.   Genitourinary: Negative.   Musculoskeletal:  Negative for back pain.  Neurological:  Negative for headaches.  Psychiatric/Behavioral:  Negative for depression.        Objective:     BP 122/80   Pulse 95   Temp 98 F (36.7 C) (Oral)   Ht 5' 6 (1.676 m)   Wt 158 lb 11.2 oz (72 kg)   LMP 08/03/2024 (Exact Date)   SpO2 98%   BMI 25.61 kg/m    Physical Exam Vitals reviewed.  Constitutional:  Appearance: Normal appearance. She is well-groomed and normal weight.  HENT:     Right Ear: Tympanic membrane and ear canal normal.     Left Ear: Tympanic membrane and ear canal normal.     Mouth/Throat:     Mouth: Mucous membranes are moist.     Pharynx: No posterior oropharyngeal erythema.  Eyes:     Conjunctiva/sclera: Conjunctivae normal.  Neck:     Thyroid : No thyromegaly.  Cardiovascular:     Rate and Rhythm: Normal rate and regular rhythm.     Pulses: Normal pulses.     Heart sounds: S1 normal and S2 normal.  Pulmonary:     Effort: Pulmonary effort is normal.     Breath sounds: Normal breath sounds and air entry.  Abdominal:     General: Abdomen is flat. Bowel sounds are  normal.     Palpations: Abdomen is soft.  Musculoskeletal:     Right lower leg: No edema.     Left lower leg: No edema.  Lymphadenopathy:     Cervical: No cervical adenopathy.  Neurological:     Mental Status: She is alert and oriented to person, place, and time. Mental status is at baseline.     Gait: Gait is intact.  Psychiatric:        Mood and Affect: Mood and affect normal.        Speech: Speech normal.        Behavior: Behavior normal.        Judgment: Judgment normal.      No results found for any visits on 08/09/24.     Assessment & Plan:    Routine Health Maintenance and Physical Exam  Immunization History  Administered Date(s) Administered   Influenza Whole 07/10/2009   Influenza, Seasonal, Injecte, Preservative Fre 06/22/2022, 07/05/2024   Influenza,inj,Quad PF,6+ Mos 06/24/2018   Influenza-Unspecified 06/15/2019, 06/09/2023   Moderna Covid-19 Fall Seasonal Vaccine 55yrs & older 05/25/2022   Pfizer(Comirnaty)Fall Seasonal Vaccine 12 years and older 07/05/2024    Health Maintenance  Topic Date Due   DTaP/Tdap/Td (1 - Tdap) Never done   Hepatitis B Vaccines 19-59 Average Risk (1 of 3 - 19+ 3-dose series) Never done   Pneumococcal Vaccine: 50+ Years (1 of 1 - PCV) Never done   Zoster Vaccines- Shingrix (1 of 2) Never done   Hepatitis C Screening  08/09/2025 (Originally 03/10/1992)   HIV Screening  08/09/2025 (Originally 03/10/1989)   Mammogram  11/02/2025   Cervical Cancer Screening (HPV/Pap Cotest)  06/21/2028   Colonoscopy  09/25/2032   Influenza Vaccine  Completed   COVID-19 Vaccine  Completed   HPV VACCINES  Aged Out   Meningococcal B Vaccine  Aged Out    Discussed health benefits of physical activity, and encouraged her to engage in regular exercise appropriate for her age and condition.  Vitamin D deficiency -     VITAMIN D 25 Hydroxy (Vit-D Deficiency, Fractures); Future  Attention deficit hyperactivity disorder (ADHD), combined type -      Amphetamine -Dextroamphet ER; Take 1 capsule (30 mg total) by mouth daily.  Dispense: 30 capsule; Refill: 0 -     Amphetamine -Dextroamphetamine ; Take 1/2-1 tablet (5-10 mg total) by mouth daily after lunch.  Dispense: 30 tablet; Refill: 0 -     Amphetamine -Dextroamphet ER; Take 1 capsule (30 mg total) by mouth daily.  Dispense: 30 capsule; Refill: 0 -     Amphetamine -Dextroamphet ER; Take 1 capsule (30 mg total) by mouth daily.  Dispense: 30 capsule; Refill:  0 -     Amphetamine -Dextroamphetamine ; Take 1/2-1 tablet (5-10 mg total) by mouth daily after lunch.  Dispense: 30 tablet; Refill: 0 -     Amphetamine -Dextroamphetamine ; Take 1/2-1 tablet (5-10 mg total) by mouth daily after lunch.  Dispense: 30 tablet; Refill: 0  Prediabetes -     Hemoglobin A1c; Future  Routine general medical examination at a health care facility -     CBC with Differential/Platelet; Future -     Comprehensive metabolic panel with GFR; Future  Lipid screening -     Lipid panel; Future  General physical exam findings are normal today. I reviewed the patient's preventative testing, immunizations, and lifestyle habits. I made appropriate recommendations and placed orders for the appropriate tests and/or vaccinations. I counseled the patient on the CDC's recommendations for healthy exercise and diet. I counseled the patient on healthy sleep habits and stress management. Handouts to reinforce the counseling were given at the conclusion of the visit.    Return in about 3 months (around 11/07/2024) for video vists for ADHD med refills.     Heron CHRISTELLA Sharper, MD

## 2024-08-10 ENCOUNTER — Ambulatory Visit: Payer: Self-pay | Admitting: Family Medicine

## 2024-09-07 ENCOUNTER — Other Ambulatory Visit (HOSPITAL_BASED_OUTPATIENT_CLINIC_OR_DEPARTMENT_OTHER): Payer: Self-pay

## 2024-09-08 ENCOUNTER — Other Ambulatory Visit (HOSPITAL_BASED_OUTPATIENT_CLINIC_OR_DEPARTMENT_OTHER): Payer: Self-pay

## 2024-09-12 ENCOUNTER — Other Ambulatory Visit (HOSPITAL_BASED_OUTPATIENT_CLINIC_OR_DEPARTMENT_OTHER): Payer: Self-pay

## 2024-09-12 ENCOUNTER — Other Ambulatory Visit: Payer: Self-pay

## 2024-09-13 ENCOUNTER — Other Ambulatory Visit (HOSPITAL_BASED_OUTPATIENT_CLINIC_OR_DEPARTMENT_OTHER): Payer: Self-pay

## 2024-09-13 ENCOUNTER — Encounter: Payer: Self-pay | Admitting: Family Medicine

## 2024-09-13 DIAGNOSIS — F902 Attention-deficit hyperactivity disorder, combined type: Secondary | ICD-10-CM

## 2024-09-14 ENCOUNTER — Other Ambulatory Visit (HOSPITAL_BASED_OUTPATIENT_CLINIC_OR_DEPARTMENT_OTHER): Payer: Self-pay

## 2024-09-14 MED ORDER — AMPHETAMINE-DEXTROAMPHETAMINE 15 MG PO TABS
15.0000 mg | ORAL_TABLET | Freq: Two times a day (BID) | ORAL | 0 refills | Status: AC
  Filled 2024-09-14: qty 60, 30d supply, fill #0

## 2024-09-16 ENCOUNTER — Other Ambulatory Visit (HOSPITAL_BASED_OUTPATIENT_CLINIC_OR_DEPARTMENT_OTHER): Payer: Self-pay

## 2024-09-16 ENCOUNTER — Other Ambulatory Visit: Payer: Self-pay
